# Patient Record
Sex: Male | Born: 1965 | Race: Black or African American | Hispanic: No | Marital: Married | Smoking: Former smoker
Health system: Southern US, Community
[De-identification: ages and names within clinical notes are randomized; demographics above are authoritative.]

## PROBLEM LIST (undated history)

## (undated) DIAGNOSIS — K635 Polyp of colon: Secondary | ICD-10-CM

## (undated) DIAGNOSIS — Z720 Tobacco use: Secondary | ICD-10-CM

## (undated) DIAGNOSIS — E2740 Unspecified adrenocortical insufficiency: Secondary | ICD-10-CM

## (undated) DIAGNOSIS — F32A Depression, unspecified: Secondary | ICD-10-CM

## (undated) DIAGNOSIS — Z8639 Personal history of other endocrine, nutritional and metabolic disease: Secondary | ICD-10-CM

## (undated) DIAGNOSIS — F329 Major depressive disorder, single episode, unspecified: Secondary | ICD-10-CM

## (undated) DIAGNOSIS — K449 Diaphragmatic hernia without obstruction or gangrene: Secondary | ICD-10-CM

## (undated) DIAGNOSIS — K219 Gastro-esophageal reflux disease without esophagitis: Secondary | ICD-10-CM

## (undated) HISTORY — PX: POLYPECTOMY: SHX149

---

## 2011-06-24 ENCOUNTER — Other Ambulatory Visit: Payer: Self-pay | Admitting: Family Medicine

## 2011-06-24 ENCOUNTER — Ambulatory Visit
Admission: RE | Admit: 2011-06-24 | Discharge: 2011-06-24 | Disposition: A | Discharge status: Home / Self Care | Payer: Managed Care, Other (non HMO) | Source: Ambulatory Visit | Attending: Family Medicine | Admitting: Family Medicine

## 2011-06-24 DIAGNOSIS — M12569 Traumatic arthropathy, unspecified knee: Secondary | ICD-10-CM

## 2011-07-23 ENCOUNTER — Other Ambulatory Visit: Payer: Self-pay | Admitting: Family Medicine

## 2011-07-23 ENCOUNTER — Other Ambulatory Visit: Payer: Self-pay | Admitting: Gastroenterology

## 2011-07-23 DIAGNOSIS — R634 Abnormal weight loss: Secondary | ICD-10-CM

## 2011-07-23 DIAGNOSIS — M25522 Pain in left elbow: Secondary | ICD-10-CM

## 2011-07-26 ENCOUNTER — Other Ambulatory Visit: Payer: Managed Care, Other (non HMO)

## 2011-07-29 ENCOUNTER — Ambulatory Visit
Admission: RE | Admit: 2011-07-29 | Discharge: 2011-07-29 | Disposition: A | Discharge status: Home / Self Care | Payer: Managed Care, Other (non HMO) | Source: Ambulatory Visit | Attending: Gastroenterology | Admitting: Gastroenterology

## 2011-07-29 ENCOUNTER — Ambulatory Visit: Payer: Managed Care, Other (non HMO)

## 2011-07-29 DIAGNOSIS — R634 Abnormal weight loss: Secondary | ICD-10-CM

## 2011-07-29 MED ORDER — IOHEXOL 300 MG/ML  SOLN
100.0000 mL | Freq: Once | INTRAMUSCULAR | Status: AC | PRN
  Administered 2011-07-29: 100 mL via INTRAVENOUS

## 2011-08-05 DIAGNOSIS — K635 Polyp of colon: Secondary | ICD-10-CM

## 2011-08-05 HISTORY — DX: Polyp of colon: K63.5

## 2011-08-06 ENCOUNTER — Emergency Department (HOSPITAL_COMMUNITY): Payer: Managed Care, Other (non HMO)

## 2011-08-06 ENCOUNTER — Observation Stay (HOSPITAL_COMMUNITY)
Admission: EM | Admit: 2011-08-06 | Discharge: 2011-08-08 | DRG: 312 | Disposition: A | Discharge status: Home / Self Care | Payer: Managed Care, Other (non HMO) | Attending: Family Medicine | Admitting: Family Medicine

## 2011-08-06 ENCOUNTER — Encounter (HOSPITAL_COMMUNITY): Payer: Self-pay | Admitting: Emergency Medicine

## 2011-08-06 ENCOUNTER — Inpatient Hospital Stay (HOSPITAL_COMMUNITY): Payer: Managed Care, Other (non HMO)

## 2011-08-06 DIAGNOSIS — R404 Transient alteration of awareness: Secondary | ICD-10-CM | POA: Insufficient documentation

## 2011-08-06 DIAGNOSIS — R42 Dizziness and giddiness: Secondary | ICD-10-CM | POA: Diagnosis present

## 2011-08-06 DIAGNOSIS — E538 Deficiency of other specified B group vitamins: Secondary | ICD-10-CM | POA: Diagnosis present

## 2011-08-06 DIAGNOSIS — D509 Iron deficiency anemia, unspecified: Secondary | ICD-10-CM | POA: Diagnosis present

## 2011-08-06 DIAGNOSIS — E860 Dehydration: Secondary | ICD-10-CM | POA: Insufficient documentation

## 2011-08-06 DIAGNOSIS — I951 Orthostatic hypotension: Principal | ICD-10-CM | POA: Diagnosis present

## 2011-08-06 DIAGNOSIS — F329 Major depressive disorder, single episode, unspecified: Secondary | ICD-10-CM

## 2011-08-06 DIAGNOSIS — R634 Abnormal weight loss: Secondary | ICD-10-CM | POA: Diagnosis present

## 2011-08-06 DIAGNOSIS — F32A Depression, unspecified: Secondary | ICD-10-CM | POA: Diagnosis present

## 2011-08-06 DIAGNOSIS — F3289 Other specified depressive episodes: Secondary | ICD-10-CM | POA: Insufficient documentation

## 2011-08-06 DIAGNOSIS — E119 Type 2 diabetes mellitus without complications: Secondary | ICD-10-CM | POA: Diagnosis present

## 2011-08-06 HISTORY — DX: Depression, unspecified: F32.A

## 2011-08-06 HISTORY — DX: Major depressive disorder, single episode, unspecified: F32.9

## 2011-08-06 LAB — BASIC METABOLIC PANEL
BUN: 13 mg/dL (ref 6–23)
CO2: 24 mEq/L (ref 19–32)
Chloride: 98 mEq/L (ref 96–112)
Creatinine, Ser: 0.91 mg/dL (ref 0.50–1.35)
Glucose, Bld: 108 mg/dL — ABNORMAL HIGH (ref 70–99)
Potassium: 4.1 mEq/L (ref 3.5–5.1)

## 2011-08-06 LAB — CBC
HCT: 31.8 % — ABNORMAL LOW (ref 39.0–52.0)
Hemoglobin: 10.5 g/dL — ABNORMAL LOW (ref 13.0–17.0)
MCV: 72.3 fL — ABNORMAL LOW (ref 78.0–100.0)
RDW: 17.8 % — ABNORMAL HIGH (ref 11.5–15.5)
WBC: 13.5 10*3/uL — ABNORMAL HIGH (ref 4.0–10.5)

## 2011-08-06 LAB — GRAM STAIN

## 2011-08-06 LAB — URINALYSIS, ROUTINE W REFLEX MICROSCOPIC
Bilirubin Urine: NEGATIVE
Glucose, UA: NEGATIVE mg/dL
Hgb urine dipstick: NEGATIVE
Specific Gravity, Urine: 1.012 (ref 1.005–1.030)
pH: 6.5 (ref 5.0–8.0)

## 2011-08-06 LAB — OCCULT BLOOD, POC DEVICE: Fecal Occult Bld: NEGATIVE

## 2011-08-06 LAB — URINE MICROSCOPIC-ADD ON

## 2011-08-06 LAB — GLUCOSE, CAPILLARY

## 2011-08-06 MED ORDER — OXYCODONE HCL 5 MG PO TABS
5.0000 mg | ORAL_TABLET | ORAL | Status: DC | PRN
  Administered 2011-08-07 – 2011-08-08 (×5): 5 mg via ORAL
  Filled 2011-08-06 (×5): qty 1

## 2011-08-06 MED ORDER — SODIUM CHLORIDE 0.9 % IV BOLUS (SEPSIS)
1000.0000 mL | Freq: Once | INTRAVENOUS | Status: AC
  Administered 2011-08-06: 1000 mL via INTRAVENOUS

## 2011-08-06 MED ORDER — ACETAMINOPHEN 325 MG PO TABS
650.0000 mg | ORAL_TABLET | Freq: Four times a day (QID) | ORAL | Status: DC | PRN
  Administered 2011-08-07: 650 mg via ORAL
  Filled 2011-08-06: qty 2

## 2011-08-06 MED ORDER — HYDROMORPHONE HCL PF 1 MG/ML IJ SOLN: 0.5000 mg | INTRAMUSCULAR | Status: DC | PRN

## 2011-08-06 MED ORDER — HYDROCODONE-ACETAMINOPHEN 5-325 MG PO TABS
1.0000 | ORAL_TABLET | Freq: Once | ORAL | Status: AC
  Administered 2011-08-06: 1 via ORAL
  Filled 2011-08-06: qty 1

## 2011-08-06 MED ORDER — ONDANSETRON HCL 4 MG/2ML IJ SOLN: 4.0000 mg | Freq: Four times a day (QID) | INTRAMUSCULAR | Status: DC | PRN

## 2011-08-06 MED ORDER — INSULIN ASPART 100 UNIT/ML ~~LOC~~ SOLN: 0.0000 [IU] | Freq: Every day | SUBCUTANEOUS | Status: DC

## 2011-08-06 MED ORDER — ACETAMINOPHEN 650 MG RE SUPP: 650.0000 mg | Freq: Four times a day (QID) | RECTAL | Status: DC | PRN

## 2011-08-06 MED ORDER — SODIUM CHLORIDE 0.9 % IV SOLN
INTRAVENOUS | Status: DC
  Administered 2011-08-06 – 2011-08-08 (×3): via INTRAVENOUS

## 2011-08-06 MED ORDER — ZOLPIDEM TARTRATE 5 MG PO TABS: 5.0000 mg | ORAL_TABLET | Freq: Every evening | ORAL | Status: DC | PRN

## 2011-08-06 MED ORDER — ALUM & MAG HYDROXIDE-SIMETH 200-200-20 MG/5ML PO SUSP: 30.0000 mL | Freq: Four times a day (QID) | ORAL | Status: DC | PRN

## 2011-08-06 MED ORDER — INSULIN ASPART 100 UNIT/ML ~~LOC~~ SOLN
0.0000 [IU] | Freq: Three times a day (TID) | SUBCUTANEOUS | Status: DC
  Administered 2011-08-07: 1 [IU] via SUBCUTANEOUS

## 2011-08-06 MED ORDER — SODIUM CHLORIDE 0.9 % IV SOLN: INTRAVENOUS | Status: DC

## 2011-08-06 MED ORDER — SODIUM CHLORIDE 0.9 % IJ SOLN
3.0000 mL | Freq: Two times a day (BID) | INTRAMUSCULAR | Status: DC
  Administered 2011-08-06 – 2011-08-07 (×2): 3 mL via INTRAVENOUS

## 2011-08-06 MED ORDER — ONDANSETRON HCL 4 MG PO TABS: 4.0000 mg | ORAL_TABLET | Freq: Four times a day (QID) | ORAL | Status: DC | PRN

## 2011-08-06 MED ORDER — ENOXAPARIN SODIUM 40 MG/0.4ML ~~LOC~~ SOLN
40.0000 mg | SUBCUTANEOUS | Status: DC
  Administered 2011-08-06: 40 mg via SUBCUTANEOUS
  Filled 2011-08-06 (×3): qty 0.4

## 2011-08-06 NOTE — ED Notes (Signed)
Pt reporting multiple syncopal episodes for several weeks, last was one a few days ago. Pt reports bp drops and then he passes out. Pt has multiple abrasions to forehead, right knee, left leg. Pt denying any pain. No cp or sob. Reporting depressed due to loss of mother. Pt reports wt loss, but has been eating lately and is concerned about not gaining wt. Pt is a x 4. Speaking in clear sentences. No neuro deficits.

## 2011-08-06 NOTE — ED Notes (Addendum)
Pt sent by PCP for eval of weight loss and syncopal episode over last week from hypotension; pt sts lost 65lb despite eating; pt sts having trouble with depression since his mother passed away 1 year ago

## 2011-08-06 NOTE — H&P (Signed)
[facility] History and Physical  CZAR YSAGUIRRE RUE:[id number] DOB: 05-26-1965 DOA: 08/06/2011  Referring physician: ED PCP:  Dr. Renaye Rakers  Chief Complaint: Dizziness and Passed Out  HPI: Frederick Maddox is an 46 y.o. Male who passed out today PTA, and has had complaints of Dizziness and falls for the past 2 weeks.  He fell 3 days ago and hit his forehead, left arm and right knee.  He was seen by his PCP and evaluated and diagnosed with contusions and a hematoma of the left forearm.  When he passed out he states he was getting up to walk into another room and blacked out, he was dizzy, and had nausea , he denies having chest pain or SOB .  H e has lost over 65 pounds over the past 6 months after the loss of his mother to complications of CHF.  He has a history of depression and reports that he was depressed and did not eat very much for the past 5 months until he went to stay with his brother and his family, and he began to regain his appetite, and go to counseling.    Review of Systems:  The patient denies fever, vision loss, decreased hearing, hoarseness, chest pain, dyspnea on exertion, peripheral edema, balance deficits, hemoptysis, abdominal pain, melena, hematochezia, severe indigestion/heartburn, hematuria, incontinence, genital sores, muscle weakness, suspicious skin lesions, transient blindness, difficulty walking,  enlarged lymph nodes, angioedema, and breast masses.    Past Medical History  Diagnosis Date  . Diabetes mellitus   . Depression    History reviewed. No pertinent past surgical history.    Prior to Admission medications   Medication Sig Start Date End Date Taking? Authorizing Provider  acetaminophen (TYLENOL) 500 MG tablet Take 1,000 mg by mouth every 6 (six) hours as needed. pain   Yes Historical Provider, MD  HYDROcodone-acetaminophen (VICODIN) 5-500 MG per tablet Take 1 tablet by mouth every 6 (six) hours as needed. pain   Yes Historical Provider, MD    megestrol (MEGACE) 40 MG/ML suspension Take 200 mg by mouth daily.   Yes Historical Provider, MD  testosterone (ANDROGEL) 50 MG/5GM GEL Place 10 g onto the skin daily. 1 pump to each arm daily .   Yes Historical Provider, MD  Vilazodone HCl (VIIBRYD) 40 MG TABS Take 40 mg by mouth daily.   Yes Historical Provider, MD    No Known Allergies    Social History:      Lives at Home, Able to Perform his ADLs.    reports that he has been smoking.  He does not have any smokeless tobacco history on file. He reports that he does not drink alcohol or use illicit drugs.   Family History  Problem Relation Age of Onset  . Diabetes type II      (be sure to complete)    GEN:  Pleasant thin well developed 46 year old African American Male  examined  and in no acute distress; cooperative with exam  Filed Vitals:  08/06/11 1615 08/06/11 1700 08/06/11 1832 08/06/11 1911 BP: 126/79 123/73 116/72 122/76 Pulse: 98 96 97 93 Temp:     TempSrc:     Resp: 23 22 18 19  SpO2: 100% 100% 100% 100%  Blood pressure 122/76, pulse 93, temperature 98.5 F (36.9 C), temperature source Oral, resp. rate 19, SpO2 100.00%. PSYCH: He is alert and oriented x4; does not appear anxious does not appear depressed; affect is normal HEENT: Normocephalic and + Hematoma Right Frontal  Area, Mucous membranes pink; PERRLA; EOM intact; Fundi:  Benign;  No scleral icterus, Nares: Patent, Oropharynx: Clear, Fair Dentition, Neck:  FROM, no cervical lymphadenopathy nor thyromegaly or carotid bruit; no JVD; Breasts:: Not examined CHEST WALL: No tenderness CHEST: Normal respiration, clear to auscultation bilaterally HEART: Regular rate and rhythm; no murmurs rubs or gallops BACK: No kyphosis or scoliosis; no CVA tenderness ABDOMEN: Positive Bowel Sounds, Scaphoid,  soft non-tender; no masses, no organomegaly.   Rectal Exam: Not done EXTREMITIES:  + Edema/Effusion of Right Peri-patellar area,  no cyanosis, clubbing or edema; no  ulcerations, + Abrasion Right Patella. Genitalia: not examined PULSES: 2+ and symmetric SKIN: Normal hydration no rash or ulceration CNS: Cranial nerves 2-12 grossly intact no focal neurologic deficit   Labs on Admission:  Basic Metabolic Panel:  Lab 08/06/11 4098  NA 136  K 4.1  CL 98  CO2 24  GLUCOSE 108*  BUN 13  CREATININE 0.91  CALCIUM 10.4  MG --  PHOS --   Liver Function Tests: No results found for this basename: AST:5,ALT:5,ALKPHOS:5,BILITOT:5,PROT:5,ALBUMIN:5 in the last 168 hours No results found for this basename: LIPASE:5,AMYLASE:5 in the last 168 hours No results found for this basename: AMMONIA:5 in the last 168 hours CBC:  Lab 08/06/11 1421  WBC 13.5*  NEUTROABS --  HGB 10.5*  HCT 31.8*  MCV 72.3*  PLT 370   Cardiac Enzymes:  Lab 08/06/11 1421  CKTOTAL --  CKMB --  CKMBINDEX --  TROPONINI <0.30    BNP (last 3 results) No results found for this basename: PROBNP:3 in the last 8760 hours CBG: No results found for this basename: GLUCAP:5 in the last 168 hours  Radiological Exams on Admission: Dg Chest 2 View  08/06/2011  *RADIOLOGY REPORT*  Clinical Data: Hypotension.  Weight loss  CHEST - 2 VIEW  Comparison:  None.  Findings:  The heart size and mediastinal contours are within normal limits.  Both lungs are clear.  The visualized skeletal structures are unremarkable.  IMPRESSION: No active cardiopulmonary disease.  Original Report Authenticated By: Camelia Phenes, M.D.    EKG: Reviewed:  Normal Sinus Rhythm No acute ST changes.     Assessment: Principal Problem:  *Orthostatic hypotension Active Problems:  Weight loss  Dizziness  Diabetes mellitus  Depression  Plan:    Admitted for Syncope Workup, etiology most likely Orthostatic hypotension.   Rehydrate Check albumin and pre-Albumin levels, and TSH Reconcile home Meds.   SSI coverage PRN elevated Blood Sugars. DVT prophylaxis Continue Counseling as Outpatient.   Code Status:    FULL CODE Family Communication:   Brother At Bedside Disposition Plan: Return Home Time spent: 60 minutes.    Ron Parker [facility] Pager [phone]  If 7PM-7AM, please contact night-coverage [email or web address] Password [facility] 08/06/2011, 8:15 PM

## 2011-08-06 NOTE — ED Notes (Signed)
Admitting at bedside 

## 2011-08-06 NOTE — ED Provider Notes (Signed)
History     CSN: [id number]  Arrival date & time 08/06/11  1349   First MD Initiated Contact with Patient 08/06/11 1501      Chief Complaint  Patient presents with  . Hypotension  . Loss of Consciousness  . Weight Loss    (Consider location/radiation/quality/duration/timing/severity/associated sxs/prior treatment) Patient is a 46 y.o. male presenting with syncope. The history is provided by the patient.  Loss of Consciousness This is a new problem. The current episode started in the past 7 days. The problem occurs every several days. The problem has been unchanged. Pertinent negatives include no abdominal pain, anorexia, change in bowel habit, chest pain, congestion, diaphoresis, fever, headaches, nausea, neck pain, numbness, vertigo, visual change, vomiting or weakness. Associated symptoms comments: 65 lb weight loss in the past year. Exacerbated by: changing position. He has tried nothing for the symptoms.    Past Medical History  Diagnosis Date  . Diabetes mellitus   . Depression     History reviewed. No pertinent past surgical history.  Family History  Problem Relation Age of Onset  . Diabetes type II      History  Substance Use Topics  . Smoking status: Current Everyday Smoker  . Smokeless tobacco: Not on file  . Alcohol Use: No      Review of Systems  Constitutional: Positive for unexpected weight change. Negative for fever and diaphoresis.  HENT: Negative for congestion and neck pain.   Eyes: Negative.   Respiratory: Negative for shortness of breath.   Cardiovascular: Positive for syncope. Negative for chest pain, palpitations and leg swelling.  Gastrointestinal: Negative for nausea, vomiting, abdominal pain, diarrhea, blood in stool, anorexia and change in bowel habit.  Genitourinary: Negative.   Neurological: Positive for syncope. Negative for vertigo, seizures, facial asymmetry, weakness, numbness and headaches.  All other systems reviewed and are  negative.    Allergies  Review of patient's allergies indicates no known allergies.  Home Medications   No current outpatient prescriptions on file.  BP 95/55  Pulse 106  Temp 98.7 F (37.1 C) (Oral)  Resp 19  Ht 6\' 1"  (1.854 m)  Wt 160 lb (72.576 kg)  BMI 21.11 kg/m2  SpO2 100%  Physical Exam  Nursing note and vitals reviewed. Constitutional: He is oriented to person, place, and time. He appears well-developed and well-nourished. No distress.  HENT:  Head: Normocephalic and atraumatic.  Eyes: Conjunctivae and EOM are normal. Pupils are equal, round, and reactive to light.  Neck: Neck supple.  Cardiovascular: Normal rate, regular rhythm, normal heart sounds and intact distal pulses.   No murmur heard. Pulmonary/Chest: Effort normal and breath sounds normal. He has no wheezes. He has no rales.  Abdominal: Soft. He exhibits no distension. There is no tenderness.  Musculoskeletal: Normal range of motion.       Right knee: He exhibits normal range of motion (extensor mechanism intact), no swelling, no effusion, no deformity, no erythema, normal alignment, no LCL laxity, normal patellar mobility, no bony tenderness, normal meniscus and no MCL laxity. tenderness (anterior) found.  Neurological: He is alert and oriented to person, place, and time. He has normal strength. No cranial nerve deficit or sensory deficit. Coordination and gait normal. GCS eye subscore is 4. GCS verbal subscore is 5. GCS motor subscore is 6.  Skin: Skin is warm and dry.    ED Course  Procedures (including critical care time)  Labs Reviewed  CBC - Abnormal; Notable for the following:    WBC  13.5 (*)     Hemoglobin 10.5 (*)     HCT 31.8 (*)     MCV 72.3 (*)     MCH 23.9 (*)     RDW 17.8 (*)     All other components within normal limits  BASIC METABOLIC PANEL - Abnormal; Notable for the following:    Glucose, Bld 108 (*)     All other components within normal limits  URINALYSIS, ROUTINE W REFLEX  MICROSCOPIC - Abnormal; Notable for the following:    Protein, ur 30 (*)     Urobilinogen, UA 2.0 (*)     All other components within normal limits  TROPONIN I  GRAM STAIN  OCCULT BLOOD, POC DEVICE  URINE MICROSCOPIC-ADD ON  GLUCOSE, CAPILLARY  BASIC METABOLIC PANEL  CBC  VITAMIN B12  FOLATE  IRON AND TIBC  FERRITIN  RETICULOCYTES  HEMOGLOBIN A1C   Dg Chest 2 View  08/06/2011  *RADIOLOGY REPORT*  Clinical Data: Hypotension.  Weight loss  CHEST - 2 VIEW  Comparison:  None.  Findings:  The heart size and mediastinal contours are within normal limits.  Both lungs are clear.  The visualized skeletal structures are unremarkable.  IMPRESSION: No active cardiopulmonary disease.  Original Report Authenticated By: Camelia Phenes, M.D.   Dg Knee 1-2 Views Right  08/06/2011  *RADIOLOGY REPORT*  Clinical Data: Fall with right knee pain.  RIGHT KNEE - 1-2 VIEW  Comparison: 06/24/2011  Findings: No evidence of acute fracture, subluxation or dislocation identified.  A very small knee effusion is present.  No radio-opaque foreign bodies are present.  No focal bony lesions are noted.  The joint spaces are unremarkable.  IMPRESSION: Very small knee effusion without acute bony abnormality.  Original Report Authenticated By: Rosendo Gros, M.D.     1. Orthostatic hypotension   2. Weight loss   3. Diabetes mellitus   4. Dizziness   5. Depression       MDM  46 yo male with history of DM presents with 1 year history of 65 lb weight loss after his mother died and has had 4 syncopal episodes in the past week.  Last episode was 2 days ago.  Pt reports hitting his head and right knee.  States that he has checked his BP and it has been low associated with the episodes.  DM well controlled with no episodes of hypoglycemia.  AF, VSS, NAD at presentation.  Physical exam as above with nml neuro exam.  Hgb 10.5, WBC 13.5.  BMP wnl.  Will check orthostatics, hemoccult, UA and give fluid bolus.   Pt with positive  orthostatic hypotension.  Will give additional bolus.  Hemoccult negative.  UA w/o signs of infection.  Will consult medicine for admission for weight loss and syncopal episodes.    Pt admitted to the Hospitalist service in stable condition.     Cherre Robins, MD 08/07/11 [phone]

## 2011-08-06 NOTE — ED Notes (Signed)
Patient transported to X-ray 

## 2011-08-07 DIAGNOSIS — D509 Iron deficiency anemia, unspecified: Secondary | ICD-10-CM | POA: Diagnosis present

## 2011-08-07 LAB — BASIC METABOLIC PANEL
BUN: 11 mg/dL (ref 6–23)
Chloride: 102 mEq/L (ref 96–112)
GFR calc Af Amer: >90 mL/min (ref >90)
Glucose, Bld: 94 mg/dL (ref 70–99)
Potassium: 3.6 mEq/L (ref 3.5–5.1)
Sodium: 136 mEq/L (ref 135–145)

## 2011-08-07 LAB — RETICULOCYTES: RBC.: 3.28 MIL/uL — ABNORMAL LOW (ref 4.22–5.81)

## 2011-08-07 LAB — CBC
HCT: 23.7 % — ABNORMAL LOW (ref 39.0–52.0)
Hemoglobin: 7.9 g/dL — ABNORMAL LOW (ref 13.0–17.0)
MCH: 24.1 pg — ABNORMAL LOW (ref 26.0–34.0)
MCHC: 33.3 g/dL (ref 30.0–36.0)
RBC: 3.28 MIL/uL — ABNORMAL LOW (ref 4.22–5.81)

## 2011-08-07 LAB — GLUCOSE, CAPILLARY
Glucose-Capillary: 84 mg/dL (ref 70–99)
Glucose-Capillary: 130 mg/dL — ABNORMAL HIGH (ref 70–99)

## 2011-08-07 LAB — IRON AND TIBC
Saturation Ratios: 4 % — ABNORMAL LOW (ref 20–55)
TIBC: 327 ug/dL (ref 215–435)
UIBC: 313 ug/dL (ref 125–400)

## 2011-08-07 LAB — HEMOGLOBIN A1C: Mean Plasma Glucose: 128 mg/dL — ABNORMAL HIGH (ref <117)

## 2011-08-07 LAB — FOLATE: Folate: 10.6 ng/mL

## 2011-08-07 MED ORDER — FERROUS SULFATE 325 (65 FE) MG PO TABS
325.0000 mg | ORAL_TABLET | Freq: Every day | ORAL | Status: DC
Start: 2011-08-08 — End: 2011-08-08
  Administered 2011-08-08: 325 mg via ORAL
  Filled 2011-08-07 (×2): qty 1

## 2011-08-07 MED ORDER — BOOST / RESOURCE BREEZE PO LIQD
1.0000 | Freq: Two times a day (BID) | ORAL | Status: DC
  Administered 2011-08-07 – 2011-08-08 (×2): 1 via ORAL

## 2011-08-07 MED ORDER — PANTOPRAZOLE SODIUM 40 MG PO TBEC
40.0000 mg | DELAYED_RELEASE_TABLET | Freq: Every day | ORAL | Status: DC
  Administered 2011-08-07 – 2011-08-08 (×2): 40 mg via ORAL
  Filled 2011-08-07: qty 1

## 2011-08-07 MED ORDER — DOCUSATE SODIUM 100 MG PO CAPS
100.0000 mg | ORAL_CAPSULE | Freq: Two times a day (BID) | ORAL | Status: DC
  Administered 2011-08-07 – 2011-08-08 (×2): 100 mg via ORAL
  Filled 2011-08-07 (×4): qty 1

## 2011-08-07 NOTE — Evaluation (Signed)
Occupational Therapy Evaluation and DIschage Patient Details Name: Frederick Maddox MRN: [id number] DOB: 05/24/1965 Today's Date: 08/07/2011 Time: 8295-6213 OT Time Calculation (min): 30 min  OT Assessment / Plan / Recommendation Clinical Impression  This 46 yo male admitted due to syncopal episode with definite orthostatic HTN, however asymptomatic during session today. No further OT needs noted, will sign off. Knee pain (RLE) is main limiting factor.    OT Assessment  Patient does not need any further OT services    Follow Up Recommendations  No OT follow up    Barriers to Discharge      Equipment Recommendations  None recommended by OT    Recommendations for Other Services    Frequency       Precautions / Restrictions Precautions Precautions: Fall Restrictions Weight Bearing Restrictions: No   Pertinent Vitals/Pain 5/ 10 knee    ADL  Eating/Feeding: Simulated;Independent Where Assessed - Eating/Feeding: Edge of bed Grooming: Simulated;Modified independent (increased tim) Where Assessed - Grooming: Unsupported standing Upper Body Bathing: Simulated;Modified independent (increased time) Where Assessed - Upper Body Bathing: Unsupported sit to stand Lower Body Bathing: Simulated;Modified independent (increased time) Where Assessed - Lower Body Bathing: Unsupported sit to stand Upper Body Dressing: Simulated;Modified independent (increased time) Where Assessed - Upper Body Dressing: Unsupported sit to stand Lower Body Dressing: Performed;Modified independent (increased time) Where Assessed - Lower Body Dressing: Unsupported sit to stand Toilet Transfer: Simulated;Modified independent (increased time) Toilet Transfer Method: Sit to stand Toilet Transfer Equipment:  (Bed, out door, down hall, back to bed) Toileting - Architect and Hygiene: Simulated;Independent Where Assessed - Toileting Clothing Manipulation and Hygiene: Standing Transfers/Ambulation Related to  ADLs: Mod I with increased time especially when he stands needs 1-2 minutes to get use to  putting RLE on ground before he starts ambulating    OT Diagnosis:    OT Problem List:   OT Treatment Interventions:     OT Goals    Visit Information  Last OT Received On: 08/07/11 Assistance Needed: +1    Subjective Data  Subjective: "I have fallen 3 or 4 times" Patient Stated Goal: Did not ask   Prior Functioning  Vision/Perception  Home Living Lives With: Family (brother and grandmother) Available Help at Discharge: Family;Available 24 hours/day Type of Home: House Home Access: Stairs to enter [employer] of Steps: 4 Entrance Stairs-Rails: Can reach both Home Layout: One level Bathroom Shower/Tub: Engineer, manufacturing systems: Standard (sink beside) Home Adaptive Equipment: Straight cane Prior Function Level of Independence: Independent Able to Take Stairs?: Yes Driving: Yes Communication Communication: No difficulties Dominant Hand: Right      Cognition  Overall Cognitive Status: Appears within functional limits for tasks assessed/performed Arousal/Alertness: Awake/alert Orientation Level: Appears intact for tasks assessed Behavior During Session: [facility] for tasks performed    Extremity/Trunk Assessment Right Upper Extremity Assessment RUE ROM/Strength/Tone: Within functional levels Left Upper Extremity Assessment LUE ROM/Strength/Tone: Within functional levels (does have a hematoma forearm near elbow)   Mobility Bed Mobility Bed Mobility: Supine to Sit;Sitting - Scoot to Edge of Bed Supine to Sit: 7: Independent Sitting - Scoot to Edge of Bed: 7: Independent Transfers Transfers: Sit to Stand;Stand to Sit Sit to Stand: 6: Modified independent (Device/Increase time);With upper extremity assist;From bed Stand to Sit: 6: Modified independent (Device/Increase time);With upper extremity assist;To bed   Exercise    Balance    End of Session OT - End of  Session Activity Tolerance: Patient tolerated treatment well Patient left: in bed;with call bell/phone  within reach (RLE propped up on pillows(3) and iced (20 minutes)) Nurse Communication: Mobility status (Mod I---nurse). Made patient aware he should keep leg propped up when he is at rest, iced at rest (20 minutes on 40 minutes off), try to bend it when ambulating, tends to walk stiffed legged.       Evette Georges [phone] 08/07/2011, 2:42 PM

## 2011-08-07 NOTE — Progress Notes (Signed)
INITIAL ADULT NUTRITION ASSESSMENT Date: 08/07/2011   Time: 12:40 PM Reason for Assessment: Consult for adult unintentional weight loss > 10 lb over 1 month.   ASSESSMENT: Male 46 y.o.  Dx: Orthostatic hypotension  Hx:  Past Medical History  Diagnosis Date  . Diabetes mellitus   . Depression     Related Meds:  Scheduled Meds:   . enoxaparin (LOVENOX) injection  40 mg Subcutaneous Q24H  . HYDROcodone-acetaminophen  1 tablet Oral Once  . insulin aspart  0-5 Units Subcutaneous QHS  . insulin aspart  0-9 Units Subcutaneous TID WC  . sodium chloride  1,000 mL Intravenous Once  . sodium chloride  1,000 mL Intravenous Once  . sodium chloride  3 mL Intravenous Q12H  . DISCONTD: sodium chloride   Intravenous STAT   Continuous Infusions:   . sodium chloride 125 mL/hr at 08/06/11 2110   PRN Meds:.acetaminophen, acetaminophen, alum & mag hydroxide-simeth, HYDROmorphone (DILAUDID) injection, ondansetron (ZOFRAN) IV, ondansetron, oxyCODONE, zolpidem   Ht: 6\' 1"  (185.4 cm)  Wt: 164 lb (74.39 kg)  Ideal Wt: 83.63 % Ideal Wt: 89% Wt Readings from Last 10 Encounters:  08/07/11 164 lb (74.39 kg)    Usual Wt: 220 lb per patient % Usual Wt: 74.5% *Pt reported he would like to get back to weighing 210 lb..   Body mass index is 21.64 kg/(m^2). (WNL)  Food/Nutrition Related Hx: Patient reported his appetite is improved now. He reported it has been poor. He reported his intake PTA was poor. He reported his weight got down to 158 lb..   Labs:  CMP     Component Value Date/Time   NA 136 08/07/2011 0535   K 3.6 08/07/2011 0535   CL 102 08/07/2011 0535   CO2 25 08/07/2011 0535   GLUCOSE 94 08/07/2011 0535   BUN 11 08/07/2011 0535   CREATININE 0.76 08/07/2011 0535   CALCIUM 9.1 08/07/2011 0535   GFRNONAA >90 08/07/2011 0535   GFRAA >90 08/07/2011 0535    Intake/Output Summary (Last 24 hours) at 08/07/11 1241 Last data filed at 08/07/11 0900  Gross per 24 hour  Intake    480 ml  Output    350 ml   Net    130 ml     Diet Order: General  Supplements/Tube Feeding: none at this time  IVF:    sodium chloride Last Rate: 125 mL/hr at 08/06/11 2110    Estimated Nutritional Needs:   Kcal: 1610-9604 Protein: 90-112 grams  Fluid: 1.8-2.5L  NUTRITION DIAGNOSIS: -Inadequate oral intake (NI-2.1).  Status: Ongoing  RELATED TO: poor appetite and unintentional weight loss  AS EVIDENCE BY: pt reported poor intake and 74.5% of UBW.   MONITORING/EVALUATION(Goals): PO intake, weights, labs 1. PO intake > 75% at meals and supplements 2. Minimize weight loss  EDUCATION NEEDS: -No education needs identified at this time  INTERVENTION: 1. Will order patient Resource Breeze BID. Provides 500 kcal and 18 grams of protein daily.  2. RD to follow for nutrition plan of care.   Dietitian [phone]  DOCUMENTATION CODES Per approved criteria  -Not Applicable    Iven Finn [facility] 08/07/2011, 12:40 PM

## 2011-08-07 NOTE — ED Provider Notes (Signed)
I saw and evaluated the patient, reviewed the resident's note and I agree with the findings and plan.   Maevis Mumby, MD 08/07/11 1710 

## 2011-08-07 NOTE — Evaluation (Signed)
Physical Therapy Evaluation Patient Details Name: Frederick Maddox MRN: [id number] DOB: 10/30/1965 Today's Date: 08/07/2011 Time: 4098-1191 PT Time Calculation (min): 11 min  PT Assessment / Plan / Recommendation Clinical Impression  Pt admitted with multiple falls caused by orthostatic hypotension. BPs taken throughout session, limited ambulation secondary to hypotension in standing. Will continue to assess in further sessions, short trial of PT for safety upon d/c.    PT Assessment  Patient needs continued PT services    Follow Up Recommendations  No PT follow up;Supervision for mobility/OOB    Barriers to Discharge        Equipment Recommendations  None recommended by PT    Recommendations for Other Services     Frequency Min 3X/week    Precautions / Restrictions Precautions Precautions: Fall Restrictions Weight Bearing Restrictions: No         Mobility  Bed Mobility Bed Mobility: Supine to Sit;Sitting - Scoot to Edge of Bed Supine to Sit: 7: Independent Sitting - Scoot to Edge of Bed: 7: Independent Sit to Supine: 6: Modified independent (Device/Increase time) Transfers Transfers: Sit to Stand;Stand to Sit Sit to Stand: 4: Min assist;With upper extremity assist;From bed Stand to Sit: 4: Min guard;With upper extremity assist;To bed Details for Transfer Assistance: Min assist required for stand secondary to pt with difficulty extending R knee secondary to pain. Cueing for hand placement Ambulation/Gait Ambulation/Gait Assistance: 4: Min guard Ambulation Distance (Feet): 30 Feet Assistive device: None Gait Pattern: Antalgic;Decreased weight shift to right;Decreased stance time - right;Decreased step length - left;Right flexed knee in stance Gait velocity: decreased gait speed Stairs: No    Exercises     PT Diagnosis: Difficulty walking;Acute pain  PT Problem List: Decreased strength;Decreased range of motion;Decreased activity tolerance;Decreased  mobility;Decreased knowledge of use of DME;Decreased safety awareness;Decreased knowledge of precautions;Cardiopulmonary status limiting activity;Pain PT Treatment Interventions: DME instruction;Gait training;Stair training;Functional mobility training;Therapeutic activities;Therapeutic exercise;Patient/family education   PT Goals Acute Rehab PT Goals PT Goal Formulation: With patient Time For Goal Achievement: 08/14/11 Potential to Achieve Goals: Good Pt will go Sit to Stand: with modified independence PT Goal: Sit to Stand - Progress: Goal set today Pt will go Stand to Sit: with modified independence PT Goal: Stand to Sit - Progress: Goal set today Pt will Transfer Bed to Chair/Chair to Bed: with modified independence PT Transfer Goal: Bed to Chair/Chair to Bed - Progress: Goal set today Pt will Ambulate: >150 feet;with supervision;with least restrictive assistive device PT Goal: Ambulate - Progress: Goal set today Pt will Go Up / Down Stairs: 3-5 stairs;with supervision;with rail(s) PT Goal: Up/Down Stairs - Progress: Goal set today  Visit Information  Last PT Received On: 08/07/11 Assistance Needed: +1    Subjective Data      Prior Functioning  Home Living Lives With: Family (brother and grandmother) Available Help at Discharge: Family;Available 24 hours/day Type of Home: House Home Access: Stairs to enter [employer] of Steps: 4 Entrance Stairs-Rails: Can reach both Home Layout: One level Bathroom Shower/Tub: Engineer, manufacturing systems: Standard Home Adaptive Equipment: Straight cane Prior Function Level of Independence: Independent Able to Take Stairs?: Yes Driving: Yes Vocation: Part time employment Communication Communication: No difficulties Dominant Hand: Right    Cognition  Overall Cognitive Status: Appears within functional limits for tasks assessed/performed Arousal/Alertness: Awake/alert Orientation Level: Appears intact for tasks  assessed Behavior During Session: [facility] for tasks performed    Extremity/Trunk Assessment Right Upper Extremity Assessment RUE ROM/Strength/Tone: Within functional levels Left Upper Extremity Assessment  LUE ROM/Strength/Tone: Within functional levels (does have a hematoma forearm near elbow) Right Lower Extremity Assessment RLE ROM/Strength/Tone: Deficits;Unable to fully assess;Due to pain RLE ROM/Strength/Tone Deficits: R knee swelling limiting full extension or flexion. Painful hip flexion. Left Lower Extremity Assessment LLE ROM/Strength/Tone: Within functional levels   Balance    End of Session PT - End of Session Equipment Utilized During Treatment: Gait belt Activity Tolerance: Patient limited by pain;Treatment limited secondary to medical complications (Comment) Patient left: in bed;with call bell/phone within reach Nurse Communication: Mobility status;Other (comment) (BPs)   Milana Kidney 08/07/2011, 5:24 PM  08/07/2011 Milana Kidney DPT PAGER: [phone] OFFICE: [phone]

## 2011-08-07 NOTE — Progress Notes (Signed)
[facility] Progress Note  08/07/2011  Subjective: Pt says he feels a lot better.  He is still weak.    Objective:  Vital signs in last 24 hours: Filed Vitals:   08/07/11 0600 08/07/11 0601 08/07/11 0602 08/07/11 0605  BP: 110/69 103/64 100/63 95/62  Pulse: 83 90 101 114  Temp: 99.1 F (37.3 C)     TempSrc: Oral     Resp:      Height:      Weight:    74.39 kg (164 lb)  SpO2: 100% 100% 100% 100%   Weight change:   Intake/Output Summary (Last 24 hours) at 08/07/11 1343 Last data filed at 08/07/11 0900  Gross per 24 hour  Intake    480 ml  Output    350 ml  Net    130 ml   Lab Results  Component Value Date   HGBA1C 6.1* 08/06/2011   Lab Results  Component Value Date   CREATININE 0.76 08/07/2011    Review of Systems As above, otherwise all reviewed and reported negative  Physical Exam General - awake, thin male, no distress, cooperative HEENT - NCAT, MMM Lungs - BBS, CTA CV - normal s1, s2 sounds Abd - soft, nondistended, no masses, nontender Ext - no C/C/E Neuro - nonfocal   Lab Results: Results for orders placed during the hospital encounter of 08/06/11 (from the past 24 hour(s))  CBC     Status: Abnormal   Collection Time   08/06/11  2:21 PM      Component Value Range   WBC 13.5 (*) 4.0 - 10.5 K/uL   RBC 4.40  4.22 - 5.81 MIL/uL   Hemoglobin 10.5 (*) 13.0 - 17.0 g/dL   HCT 16.1 (*) 09.6 - 04.5 %   MCV 72.3 (*) 78.0 - 100.0 fL   MCH 23.9 (*) 26.0 - 34.0 pg   MCHC 33.0  30.0 - 36.0 g/dL   RDW 40.9 (*) 81.1 - 91.4 %   Platelets 370  150 - 400 K/uL  BASIC METABOLIC PANEL     Status: Abnormal   Collection Time   08/06/11  2:21 PM      Component Value Range   Sodium 136  135 - 145 mEq/L   Potassium 4.1  3.5 - 5.1 mEq/L   Chloride 98  96 - 112 mEq/L   CO2 24  19 - 32 mEq/L   Glucose, Bld 108 (*) 70 - 99 mg/dL   BUN 13  6 - 23 mg/dL   Creatinine, Ser 7.82  0.50 - 1.35 mg/dL   Calcium 95.6  8.4 - 21.3 mg/dL   GFR calc non Af Amer >90  >90 mL/min   GFR calc Af Amer >90  >90 mL/min  TROPONIN I     Status: Normal   Collection Time   08/06/11  2:21 PM      Component Value Range   Troponin I <0.30  <0.30 ng/mL  OCCULT BLOOD, POC DEVICE     Status: Normal   Collection Time   08/06/11  4:09 PM      Component Value Range   Fecal Occult Bld NEGATIVE    URINALYSIS, ROUTINE W REFLEX MICROSCOPIC     Status: Abnormal   Collection Time   08/06/11  4:49 PM      Component Value Range   Color, Urine YELLOW  YELLOW   APPearance CLEAR  CLEAR   Specific Gravity, Urine 1.012  1.005 - 1.030   pH 6.5  5.0 - 8.0   Glucose, UA NEGATIVE  NEGATIVE mg/dL   Hgb urine dipstick NEGATIVE  NEGATIVE   Bilirubin Urine NEGATIVE  NEGATIVE   Ketones, ur NEGATIVE  NEGATIVE mg/dL   Protein, ur 30 (*) NEGATIVE mg/dL   Urobilinogen, UA 2.0 (*) 0.0 - 1.0 mg/dL   Nitrite NEGATIVE  NEGATIVE   Leukocytes, UA NEGATIVE  NEGATIVE  GRAM STAIN     Status: Normal   Collection Time   08/06/11  4:49 PM      Component Value Range   Specimen Description URINE, RANDOM     Special Requests NONE     Gram Stain       Value: CYTOSPIN SLIDE     WBC PRESENT, PREDOMINANTLY PMN     NEGATIVE FOR BACTERIA   Report Status 08/06/2011 FINAL    URINE MICROSCOPIC-ADD ON     Status: Normal   Collection Time   08/06/11  4:49 PM      Component Value Range   Squamous Epithelial / LPF RARE  RARE   WBC, UA 0-2  <3 WBC/hpf  GLUCOSE, CAPILLARY     Status: Normal   Collection Time   08/06/11  8:47 PM      Component Value Range   Glucose-Capillary 98  70 - 99 mg/dL   Comment 1 Notify RN    HEMOGLOBIN A1C     Status: Abnormal   Collection Time   08/06/11  9:07 PM      Component Value Range   Hemoglobin A1C 6.1 (*) <5.7 %   Mean Plasma Glucose 128 (*) <117 mg/dL  BASIC METABOLIC PANEL     Status: Normal   Collection Time   08/07/11  5:35 AM      Component Value Range   Sodium 136  135 - 145 mEq/L   Potassium 3.6  3.5 - 5.1 mEq/L   Chloride 102  96 - 112 mEq/L   CO2 25  19 - 32 mEq/L   Glucose,  Bld 94  70 - 99 mg/dL   BUN 11  6 - 23 mg/dL   Creatinine, Ser 0.98  0.50 - 1.35 mg/dL   Calcium 9.1  8.4 - 11.9 mg/dL   GFR calc non Af Amer >90  >90 mL/min   GFR calc Af Amer >90  >90 mL/min  CBC     Status: Abnormal   Collection Time   08/07/11  5:35 AM      Component Value Range   WBC 10.6 (*) 4.0 - 10.5 K/uL   RBC 3.28 (*) 4.22 - 5.81 MIL/uL   Hemoglobin 7.9 (*) 13.0 - 17.0 g/dL   HCT 14.7 (*) 82.9 - 56.2 %   MCV 72.3 (*) 78.0 - 100.0 fL   MCH 24.1 (*) 26.0 - 34.0 pg   MCHC 33.3  30.0 - 36.0 g/dL   RDW 13.0 (*) 86.5 - 78.4 %   Platelets 304  150 - 400 K/uL  IRON AND TIBC     Status: Abnormal   Collection Time   08/07/11  5:35 AM      Component Value Range   Iron 14 (*) 42 - 135 ug/dL   TIBC 696  295 - 284 ug/dL   Saturation Ratios 4 (*) 20 - 55 %   UIBC 313  125 - 400 ug/dL  RETICULOCYTES     Status: Abnormal   Collection Time   08/07/11  5:35 AM      Component Value Range  Retic Ct Pct 1.2  0.4 - 3.1 %   RBC. 3.28 (*) 4.22 - 5.81 MIL/uL   Retic Count, Manual 39.4  19.0 - 186.0 K/uL  GLUCOSE, CAPILLARY     Status: Normal   Collection Time   08/07/11  7:45 AM      Component Value Range   Glucose-Capillary 84  70 - 99 mg/dL   Comment 1 Notify RN    GLUCOSE, CAPILLARY     Status: Normal   Collection Time   08/07/11 11:34 AM      Component Value Range   Glucose-Capillary 92  70 - 99 mg/dL   Comment 1 Notify RN      Micro Results: Recent Results (from the past 240 hour(s))  GRAM STAIN     Status: Normal   Collection Time   08/06/11  4:49 PM      Component Value Range Status Comment   Specimen Description URINE, RANDOM   Final    Special Requests NONE   Final    Gram Stain     Final    Value: CYTOSPIN SLIDE     WBC PRESENT, PREDOMINANTLY PMN     NEGATIVE FOR BACTERIA   Report Status 08/06/2011 FINAL   Final     Medications:  Scheduled Meds:   . enoxaparin (LOVENOX) injection  40 mg Subcutaneous Q24H  . HYDROcodone-acetaminophen  1 tablet Oral Once  . insulin  aspart  0-5 Units Subcutaneous QHS  . insulin aspart  0-9 Units Subcutaneous TID WC  . sodium chloride  1,000 mL Intravenous Once  . sodium chloride  1,000 mL Intravenous Once  . sodium chloride  3 mL Intravenous Q12H  . DISCONTD: sodium chloride   Intravenous STAT   Continuous Infusions:   . sodium chloride 125 mL/hr at 08/06/11 2110   PRN Meds:.acetaminophen, acetaminophen, alum & mag hydroxide-simeth, HYDROmorphone (DILAUDID) injection, ondansetron (ZOFRAN) IV, ondansetron, oxyCODONE, zolpidem  Assessment/Plan:  Orthostatic hypotension - continue syncope work up as ordered, already improving with IVF hydration  Anemia - check Anemia panel, likely pt is iron deficient with low MCV, stool hemoccult negative, start protonix, start iron supplement, outpatient GI consult recommended.    Weight loss - pending Albumin, TSH and prealbumin labs  Dizziness - likely related to significant dehydration  Diabetes mellitus - monitor BS and provide supplemental insulin as needed  Depression - pt to continue outpatient counseling   LOS: 1 day   Clanford Johnson 08/07/2011, 1:43 PM  Cleora Fleet, MD, CDE, FAAFP [facility] [place], [place]  [phone]

## 2011-08-08 DIAGNOSIS — E538 Deficiency of other specified B group vitamins: Secondary | ICD-10-CM | POA: Diagnosis present

## 2011-08-08 DIAGNOSIS — D509 Iron deficiency anemia, unspecified: Secondary | ICD-10-CM

## 2011-08-08 LAB — COMPREHENSIVE METABOLIC PANEL
AST: 20 U/L (ref 0–37)
Alkaline Phosphatase: 165 U/L — ABNORMAL HIGH (ref 39–117)
BUN: 7 mg/dL (ref 6–23)
CO2: 19 mEq/L (ref 19–32)
Chloride: 105 mEq/L (ref 96–112)
Creatinine, Ser: 0.69 mg/dL (ref 0.50–1.35)
GFR calc non Af Amer: >90 mL/min (ref >90)
Total Bilirubin: 0.3 mg/dL (ref 0.3–1.2)

## 2011-08-08 LAB — CBC
MCH: 23.4 pg — ABNORMAL LOW (ref 26.0–34.0)
Platelets: 282 10*3/uL (ref 150–400)
RBC: 3.33 MIL/uL — ABNORMAL LOW (ref 4.22–5.81)
RDW: 18.2 % — ABNORMAL HIGH (ref 11.5–15.5)

## 2011-08-08 LAB — GLUCOSE, CAPILLARY: Glucose-Capillary: 115 mg/dL — ABNORMAL HIGH (ref 70–99)

## 2011-08-08 LAB — PREALBUMIN: Prealbumin: 24.1 mg/dL (ref 17.0–34.0)

## 2011-08-08 MED ORDER — DSS 100 MG PO CAPS
100.0000 mg | ORAL_CAPSULE | Freq: Two times a day (BID) | ORAL | Status: AC | PRN
Start: 2011-08-08 — End: 2011-08-18

## 2011-08-08 MED ORDER — OMEPRAZOLE 20 MG PO CPDR: 20.0000 mg | DELAYED_RELEASE_CAPSULE | Freq: Every day | ORAL | Status: DC

## 2011-08-08 MED ORDER — FERROUS SULFATE 325 (65 FE) MG PO TABS: 325.0000 mg | ORAL_TABLET | Freq: Every day | ORAL | Status: DC

## 2011-08-08 MED ORDER — VITAMIN B-12 1000 MCG PO TABS: 1000.0000 ug | ORAL_TABLET | Freq: Every day | ORAL | Status: DC

## 2011-08-08 NOTE — Discharge Summary (Signed)
Physician Discharge Summary  Patient ID: Frederick Maddox MRN: [id number] DOB/AGE: 1965/12/13 46 y.o.  Admit date: 08/06/2011 Discharge date: 08/08/2011  Discharge Diagnoses:    Weight loss  Dizziness  Diabetes mellitus  Depression  Iron deficiency anemia  B12 deficiency  Orthostatic hypotension - treated with fluids and resolved  dehydration - treated and resolved  Recommendations for providers at follow up:   Please consider ECHO and carotid dopplers if pt has any recurrence of syncopal symptoms.  Please check CBC and UA on follow up visit.    Discharged Condition: good, stable  Hospital Course: From H&P:  Frederick Maddox is an 46 y.o. Male who passed out today PTA, and has had complaints of Dizziness and falls for the past 2 weeks. He fell 3 days ago and hit his forehead, left arm and right knee. He was seen by his PCP and evaluated and diagnosed with contusions and a hematoma of the left forearm. When he passed out he states he was getting up to walk into another room and blacked out, he was dizzy, and had nausea , he denies having chest pain or SOB . H e has lost over 65 pounds over the past 6 months after the loss of his mother to complications of CHF. He has a history of depression and reports that he was depressed and did not eat very much for the past 5 months until he went to stay with his brother and his family, and he began to regain his appetite, and go to counseling.   Orthostatic hypotension - pt improved considerably after IV fluid hydration.   No further syncopal episodes occurred.   PT evaluated and no further PT recommended.    Anemia - Pt is iron deficient with Iron level of 14 and his B12 level was also low at 202.  Started pt on oral iron tablets, stool hemoccult negative, home on prilosec 20 mg daily,  Colonoscopy scheduled for 3 days with Dr. Bosie Clos.   REcheck CBC 1 week.  Pt declined B12 injection.     Weight loss - TSH normal, dietitian started pt on Ensure BID    Dizziness - Resolved now.  likely related to significant dehydration   Diabetes mellitus - A1c - 6.1%,  Carb modified diet to be resumed.   Depression - pt to continue outpatient counseling and close follow up with primary care physician  Discharge Exam: Pt says that he feels well.  He is in no distress, He is asking to go home.   Blood pressure 105/67, pulse 100, temperature 99.6 F (37.6 C), temperature source Oral, resp. rate 18, height 6\' 1"  (1.854 m), weight 73.6 kg (162 lb 4.1 oz), SpO2 100.00%. 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 Have your colonoscopy on Wednesday as scheduled See your primary physician in 1 week to have your CBC rechecked Drink 2 Ensure drinks per day and iron rich diet  Drink plenty of water everyday  Weigh yourself daily  Medication List  As of 08/08/2011 10:55 AM   TAKE these medications         acetaminophen 500 MG tablet   Commonly known as: TYLENOL   Take 1,000 mg by mouth every 6 (six) hours as needed. pain      DSS 100 MG Caps   Take 100 mg by mouth 2 (two) times daily as needed for constipation.      ferrous sulfate 325 (65 FE) MG tablet   Take 1 tablet (325 mg total) by mouth daily with breakfast.      HYDROcodone-acetaminophen 5-500 MG per tablet   Commonly known as: VICODIN   Take 1 tablet by mouth every 6 (six) hours as needed. pain      megestrol 40 MG/ML suspension   Commonly known as: MEGACE   Take 200 mg by mouth daily.      omeprazole 20 MG capsule   Commonly known as: PRILOSEC   Take 1  capsule (20 mg total) by mouth daily.      testosterone 50 MG/5GM Gel   Commonly known as: ANDROGEL   Place 10 g onto the skin daily. 1 pump to each arm daily .      VIIBRYD 40 MG Tabs   Generic drug: Vilazodone HCl   Take 40 mg by mouth daily.      vitamin B-12 1000 MCG tablet   Commonly known as: CYANOCOBALAMIN   Take 1 tablet (1,000 mcg total) by mouth daily.           Follow-up Information    Follow up with Geraldo Pitter, MD in 1 week. [facility] Follow up, Check CBC)    Contact information:   [address] [place] [address] [phone]       Follow up with Shirley Friar., MD in 3 days. (colonoscopy, hospital follow up )    Contact information:   [address] [employer], [place]. [facility] [place] [address] [phone]         I spent 39 mins preparing discharge, reviewing records, labs, images, counseling patient, reconciling meds, writing prescriptions.  Signed: Clanford JohnsonMD 08/08/2011, 10:55 AM [facility] [place], [place] [phone]

## 2011-08-08 NOTE — Progress Notes (Signed)
Pt with some blood at tip of penis after voiding (very small amount). Urine in urinal is clear without signs of blood. Pt denies any discomfort with voiding. Will cont to monitor.

## 2011-08-22 ENCOUNTER — Encounter (HOSPITAL_COMMUNITY): Payer: Self-pay | Admitting: *Deleted

## 2011-08-22 ENCOUNTER — Inpatient Hospital Stay (HOSPITAL_COMMUNITY)
Admission: EM | Admit: 2011-08-22 | Discharge: 2011-08-24 | DRG: 378 | Disposition: A | Discharge status: Home / Self Care | Payer: Managed Care, Other (non HMO) | Attending: Internal Medicine | Admitting: Internal Medicine

## 2011-08-22 DIAGNOSIS — F329 Major depressive disorder, single episode, unspecified: Secondary | ICD-10-CM | POA: Diagnosis present

## 2011-08-22 DIAGNOSIS — D509 Iron deficiency anemia, unspecified: Secondary | ICD-10-CM | POA: Diagnosis present

## 2011-08-22 DIAGNOSIS — Z720 Tobacco use: Secondary | ICD-10-CM

## 2011-08-22 DIAGNOSIS — D649 Anemia, unspecified: Secondary | ICD-10-CM

## 2011-08-22 DIAGNOSIS — Z860100 Personal history of colon polyps, unspecified: Secondary | ICD-10-CM

## 2011-08-22 DIAGNOSIS — R197 Diarrhea, unspecified: Secondary | ICD-10-CM | POA: Diagnosis present

## 2011-08-22 DIAGNOSIS — E2740 Unspecified adrenocortical insufficiency: Secondary | ICD-10-CM

## 2011-08-22 DIAGNOSIS — E119 Type 2 diabetes mellitus without complications: Secondary | ICD-10-CM

## 2011-08-22 DIAGNOSIS — R42 Dizziness and giddiness: Secondary | ICD-10-CM

## 2011-08-22 DIAGNOSIS — F32A Depression, unspecified: Secondary | ICD-10-CM | POA: Diagnosis present

## 2011-08-22 DIAGNOSIS — D62 Acute posthemorrhagic anemia: Secondary | ICD-10-CM | POA: Diagnosis present

## 2011-08-22 DIAGNOSIS — R634 Abnormal weight loss: Secondary | ICD-10-CM | POA: Diagnosis present

## 2011-08-22 DIAGNOSIS — E7800 Pure hypercholesterolemia, unspecified: Secondary | ICD-10-CM | POA: Diagnosis present

## 2011-08-22 DIAGNOSIS — I951 Orthostatic hypotension: Secondary | ICD-10-CM

## 2011-08-22 DIAGNOSIS — E538 Deficiency of other specified B group vitamins: Secondary | ICD-10-CM

## 2011-08-22 DIAGNOSIS — K922 Gastrointestinal hemorrhage, unspecified: Principal | ICD-10-CM

## 2011-08-22 DIAGNOSIS — K219 Gastro-esophageal reflux disease without esophagitis: Secondary | ICD-10-CM | POA: Diagnosis present

## 2011-08-22 DIAGNOSIS — F17200 Nicotine dependence, unspecified, uncomplicated: Secondary | ICD-10-CM | POA: Diagnosis present

## 2011-08-22 DIAGNOSIS — F3289 Other specified depressive episodes: Secondary | ICD-10-CM | POA: Diagnosis present

## 2011-08-22 DIAGNOSIS — R55 Syncope and collapse: Secondary | ICD-10-CM

## 2011-08-22 HISTORY — DX: Tobacco use: Z72.0

## 2011-08-22 HISTORY — DX: Personal history of other endocrine, nutritional and metabolic disease: Z86.39

## 2011-08-22 HISTORY — DX: Gastro-esophageal reflux disease without esophagitis: K21.9

## 2011-08-22 HISTORY — DX: Polyp of colon: K63.5

## 2011-08-22 HISTORY — DX: Diaphragmatic hernia without obstruction or gangrene: K44.9

## 2011-08-22 LAB — URINALYSIS, MICROSCOPIC ONLY
Bilirubin Urine: NEGATIVE
Glucose, UA: NEGATIVE mg/dL
Hgb urine dipstick: NEGATIVE
Ketones, ur: NEGATIVE mg/dL
Leukocytes, UA: NEGATIVE
Nitrite: NEGATIVE
Protein, ur: NEGATIVE mg/dL
Specific Gravity, Urine: 1.012 (ref 1.005–1.030)
Urobilinogen, UA: 2 mg/dL — ABNORMAL HIGH (ref 0.0–1.0)
pH: 7 (ref 5.0–8.0)

## 2011-08-22 LAB — CBC WITH DIFFERENTIAL/PLATELET
Basophils Absolute: 0 K/uL (ref 0.0–0.1)
Basophils Relative: 0 % (ref 0–1)
Eosinophils Absolute: 0.1 K/uL (ref 0.0–0.7)
Eosinophils Relative: 1 % (ref 0–5)
HCT: 29.2 % — ABNORMAL LOW (ref 39.0–52.0)
Hemoglobin: 9.4 g/dL — ABNORMAL LOW (ref 13.0–17.0)
Lymphocytes Relative: 31 % (ref 12–46)
Lymphs Abs: 3.2 K/uL (ref 0.7–4.0)
MCH: 23.9 pg — ABNORMAL LOW (ref 26.0–34.0)
MCHC: 32.2 g/dL (ref 30.0–36.0)
MCV: 74.1 fL — ABNORMAL LOW (ref 78.0–100.0)
Monocytes Absolute: 0.5 K/uL (ref 0.1–1.0)
Monocytes Relative: 5 % (ref 3–12)
Neutro Abs: 6.4 K/uL (ref 1.7–7.7)
Neutrophils Relative %: 63 % (ref 43–77)
Platelets: 382 K/uL (ref 150–400)
RBC: 3.94 MIL/uL — ABNORMAL LOW (ref 4.22–5.81)
RDW: 19.6 % — ABNORMAL HIGH (ref 11.5–15.5)
WBC: 10.3 K/uL (ref 4.0–10.5)

## 2011-08-22 LAB — POCT I-STAT, CHEM 8
Chloride: 105 mEq/L (ref 96–112)
Glucose, Bld: 95 mg/dL (ref 70–99)
HCT: 32 % — ABNORMAL LOW (ref 39.0–52.0)
Hemoglobin: 10.9 g/dL — ABNORMAL LOW (ref 13.0–17.0)
Potassium: 3.8 mEq/L (ref 3.5–5.1)
Sodium: 143 mEq/L (ref 135–145)

## 2011-08-22 LAB — OCCULT BLOOD, POC DEVICE: Fecal Occult Bld: POSITIVE

## 2011-08-22 MED ORDER — ONDANSETRON HCL 4 MG PO TABS: 4.0000 mg | ORAL_TABLET | Freq: Four times a day (QID) | ORAL | Status: DC | PRN

## 2011-08-22 MED ORDER — NICOTINE POLACRILEX 2 MG MT GUM
2.0000 mg | CHEWING_GUM | OROMUCOSAL | Status: DC | PRN
  Administered 2011-08-22 – 2011-08-24 (×4): 2 mg via ORAL
  Filled 2011-08-22 (×5): qty 1

## 2011-08-22 MED ORDER — POLYETHYLENE GLYCOL 3350 17 G PO PACK
17.0000 g | PACK | Freq: Every day | ORAL | Status: DC | PRN
  Filled 2011-08-22: qty 1

## 2011-08-22 MED ORDER — FLUDROCORTISONE ACETATE 0.1 MG PO TABS
0.1000 mg | ORAL_TABLET | Freq: Every morning | ORAL | Status: DC
  Administered 2011-08-23 – 2011-08-24 (×2): 0.1 mg via ORAL
  Filled 2011-08-22 (×2): qty 1

## 2011-08-22 MED ORDER — ENOXAPARIN SODIUM 40 MG/0.4ML ~~LOC~~ SOLN
40.0000 mg | SUBCUTANEOUS | Status: DC
Start: 2011-08-22 — End: 2011-08-23
  Filled 2011-08-22 (×2): qty 0.4

## 2011-08-22 MED ORDER — VITAMIN B-12 1000 MCG PO TABS
1000.0000 ug | ORAL_TABLET | Freq: Every day | ORAL | Status: DC
  Administered 2011-08-22 – 2011-08-24 (×3): 1000 ug via ORAL
  Filled 2011-08-22 (×4): qty 1

## 2011-08-22 MED ORDER — SODIUM CHLORIDE 0.9 % IV BOLUS (SEPSIS)
1000.0000 mL | Freq: Once | INTRAVENOUS | Status: AC
  Administered 2011-08-22: 1000 mL via INTRAVENOUS

## 2011-08-22 MED ORDER — ACETAMINOPHEN 500 MG PO TABS
1000.0000 mg | ORAL_TABLET | Freq: Four times a day (QID) | ORAL | Status: DC | PRN
  Filled 2011-08-22: qty 2

## 2011-08-22 MED ORDER — SODIUM CHLORIDE 0.9 % IV SOLN
INTRAVENOUS | Status: DC
  Administered 2011-08-22 – 2011-08-23 (×2): via INTRAVENOUS
  Administered 2011-08-23: 500 mL via INTRAVENOUS

## 2011-08-22 MED ORDER — MEGESTROL ACETATE 400 MG/10ML PO SUSP
625.0000 mg | Freq: Every day | ORAL | Status: DC
  Administered 2011-08-22 – 2011-08-24 (×3): 625 mg via ORAL
  Filled 2011-08-22 (×3): qty 20

## 2011-08-22 MED ORDER — ONDANSETRON HCL 4 MG/2ML IJ SOLN: 4.0000 mg | Freq: Four times a day (QID) | INTRAMUSCULAR | Status: DC | PRN

## 2011-08-22 MED ORDER — TESTOSTERONE 50 MG/5GM (1%) TD GEL
5.0000 g | Freq: Every day | TRANSDERMAL | Status: DC
  Administered 2011-08-22 – 2011-08-24 (×2): 5 g via TRANSDERMAL
  Filled 2011-08-22 (×2): qty 5

## 2011-08-22 MED ORDER — MEGESTROL ACETATE 625 MG/5ML PO SUSP: 625.0000 mg | Freq: Every day | ORAL | Status: DC

## 2011-08-22 MED ORDER — TESTOSTERONE 20.25 MG/1.25GM (1.62%) TD GEL: 2.0000 "application " | Freq: Every day | TRANSDERMAL | Status: DC

## 2011-08-22 MED ORDER — FERROUS SULFATE 325 (65 FE) MG PO TABS
325.0000 mg | ORAL_TABLET | Freq: Every day | ORAL | Status: DC
  Administered 2011-08-23 – 2011-08-24 (×2): 325 mg via ORAL
  Filled 2011-08-22 (×3): qty 1

## 2011-08-22 MED ORDER — PANTOPRAZOLE SODIUM 40 MG PO TBEC: 40.0000 mg | DELAYED_RELEASE_TABLET | Freq: Every day | ORAL | Status: DC

## 2011-08-22 MED ORDER — ADULT MULTIVITAMIN W/MINERALS CH
1.0000 | ORAL_TABLET | Freq: Every day | ORAL | Status: DC
  Administered 2011-08-22 – 2011-08-24 (×3): 1 via ORAL
  Filled 2011-08-22 (×3): qty 1

## 2011-08-22 MED ORDER — SODIUM CHLORIDE 0.9 % IJ SOLN
3.0000 mL | Freq: Two times a day (BID) | INTRAMUSCULAR | Status: DC
  Administered 2011-08-24: 3 mL via INTRAVENOUS

## 2011-08-22 MED ORDER — ALUM & MAG HYDROXIDE-SIMETH 200-200-20 MG/5ML PO SUSP: 30.0000 mL | Freq: Four times a day (QID) | ORAL | Status: DC | PRN

## 2011-08-22 NOTE — ED Notes (Addendum)
Pt reports almost everyday for the last couple weeks pt has had dizziness when he stands up. Denies any syncopal episodes reports just near syncope. Denies chest pain or sob associated with symptoms.

## 2011-08-22 NOTE — ED Notes (Addendum)
C/o intermittent episodes of lightheadedness & dizziness with position changes x 3 weeks. States has had 4 syncopal episodes since then. Was seen here beginning of Aug for same. C/o diarrhea x 3 weeks average 4 times a week. Reports had a colonoscopy Aug 7th & had polyps removed. Denies n/v, palpitations, CP.

## 2011-08-22 NOTE — ED Provider Notes (Signed)
I saw and evaluated the patient, reviewed the resident's note and I agree with the findings and plan.   I reivewed ECG and agree with resident interpretation.    Pt is orthostatic profoundly, currently hemoconcentration, meaning true Hgb reflection may be in the 7 range, failing outpt therapy at this time, would benefit from admission.  No resp distress, sats ok.  On monitor, resting comfortably, no pain at the moment.    Gavin Pound. Oletta Lamas, MD 08/22/11 0454

## 2011-08-22 NOTE — H&P (Signed)
[facility] History and Physical  Frederick Maddox ZOX:[id number] DOB: [birth year] DOA: 08/22/2011  Referring physician: Dr. Earlene Plater PCP: Geraldo Pitter, MD   Chief Complaint: Blood pressure dropping when I stand up  HPI:  The patient is a 46 year old male with history of DM dx 2 years ago, depression, tobacco use, and GERD who presents with lightheadedness while sitting up or standing.  He has been ill since two years ago when he was diagnosed with DM after which time he lost almost 100lbs which he attributes to uncontrolled depression and not eating.  About two months ago, he was seen by his primary care doctor who was concerned about abdominal cramps in the setting of weight loss and referred him for colonoscopy.  Six weeks ago, he started having episodes of lightheadedness/presyncope requiring him to lie down.  He was admitted to [facility] 8/2 after he progressed to a syncopal episode.  He was found to be anemia with iron and B12 deficiency and started on iron and B12 PO.  He underwent EGD and colonoscopy which revealed blood clot in the stomach and two colonic polyps which were resected.  Since then, he has had progressive lightheadedness and dizziness with sitting and standing.  His aunt has been taking his blood pressures, which were reviewed and reveal >31mmHg drop in BP and > 20bpm increase in HR.  His PCP prescribed megace and florinef, which he started taking two days ago.  This morning, he felt lightheaded even lying in bed.  He came to the ER because he was unable to complete ADLS.    In the ER, he was orthostatic and given 2L of IVF.  His hemoglobin has recovered somewhat from his previous admission.     Review of Systems:  Chills, but no fever, no night sweats, no nausea, vomiting.  Diarrhea 4 times per week and twice it is really watery and other times soft like pudding.  Occasionally has a more formed stool, about once every five to six stools, but no visible blood and no black  tarry stools.  Urinates a lot, but denies dysuria.  Denies lymphanopathy, abnormal bruising, and depression is currently well controlled.    Past Medical History  Diagnosis Date  . Diabetes mellitus   . Depression   . Colonic polyp August 2013    Two removed, pathology pending  . Hiatal hernia   . GERD (gastroesophageal reflux disease)   . Tobacco use   . H/O hypotestosteronemia    Past Surgical History  Procedure Date  . Polypectomy    Social History:  reports that he has been smoking Cigarettes.  He has been smoking about .5 packs per day. He does not have any smokeless tobacco history on file. He reports that he drinks about .5 ounces of alcohol per week. He reports that he does not use illicit drugs. Lives alone but he has been staying with your aunt who is helping him by getting him to eat.  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    No Known Allergies  Family History  Problem Relation Age of Onset  . Diabetes type II Mother     many family members  . Hypertension Brother     many family members  . Breast cancer Maternal Grandmother   . Breast cancer Cousin   . Brain cancer Maternal Grandmother     Prior to Admission medications   Medication Sig Start Date End Date Taking? Authorizing Provider  acetaminophen (TYLENOL) 500 MG tablet Take 1,000 mg by mouth every 6 (six) hours as needed. pain   Yes Historical Provider, MD  docusate sodium (COLACE) 100 MG capsule Take 100 mg by mouth daily.   Yes Historical Provider, MD  ferrous sulfate 325 (65 FE) MG tablet Take 325 mg by mouth daily with breakfast. 08/08/11 08/07/12 Yes Clanford Cyndie Mull, MD  fludrocortisone (FLORINEF) 0.1 MG tablet Take 0.1 mg by mouth every morning.   Yes Historical Provider, MD  HYDROcodone-acetaminophen (VICODIN) 5-500 MG per tablet Take 1 tablet by mouth every 6 (six) hours as needed. pain   Yes Historical Provider, MD  megestrol (MEGACE ES) 625 MG/5ML  suspension Take 625 mg by mouth daily.   Yes Historical Provider, MD  omeprazole (PRILOSEC) 20 MG capsule Take 20 mg by mouth daily. 08/08/11 08/07/12 Yes Clanford Cyndie Mull, MD  Testosterone (ANDROGEL) 20.25 MG/1.25GM (1.62%) GEL Place 2 application onto the skin daily. 2 pump each day to upper arm and chest (1 to left side, 1 to right side)   Yes Historical Provider, MD  Vilazodone HCl (VIIBRYD) 40 MG TABS Take 40 mg by mouth daily.   Yes Historical Provider, MD  vitamin B-12 (CYANOCOBALAMIN) 1000 MCG tablet Take 1,000 mcg by mouth daily. 08/08/11 08/07/12 Yes Clanford Cyndie Mull, MD   Physical Exam: Filed Vitals:   08/22/11 1730 08/22/11 1830 08/22/11 1930 08/22/11 2000  BP: 118/77 123/82 118/77 119/81  Pulse: 96 93 88 97  Temp:      TempSrc:      Resp: 20 20 21 12   SpO2: 100% 100% 100% 100%     General:  Cachectic AAM, no acute distress, lying in bed  Eyes: PERRL, anicteric, noninjected  ENT: nares and OP clear  Neck: supple, no discernable JVP  Cardiovascular: RRR, no murmurs, rubs, gallops, or skipped beats.    Respiratory: CTAB  Abdomen: NABS, soft, nondistended, nontender, no organomegaly  Skin: Scar on left shin  Musculoskeletal: Normal tone and bulk  Psychiatric: A&O x 4, normal affect  Neurologic: II-XII intact, strength 5/5 throughout, sensation intact to light touch, no dysmetria  Lymph:  No cervical or supraclavicular LAD  Labs on Admission:  Basic Metabolic Panel:  Lab 08/22/11 1610  NA 143  K 3.8  CL 105  CO2 --  GLUCOSE 95  BUN 7  CREATININE 0.90  CALCIUM --  MG --  PHOS --   Liver Function Tests: No results found for this basename: AST:5,ALT:5,ALKPHOS:5,BILITOT:5,PROT:5,ALBUMIN:5 in the last 168 hours No results found for this basename: LIPASE:5,AMYLASE:5 in the last 168 hours No results found for this basename: AMMONIA:5 in the last 168 hours CBC:  Lab 08/22/11 1322 08/22/11 1310  WBC -- 10.3  NEUTROABS -- 6.4  HGB 10.9* 9.4*  HCT 32.0*  29.2*  MCV -- 74.1*  PLT -- 382   Cardiac Enzymes: No results found for this basename: CKTOTAL:5,CKMB:5,CKMBINDEX:5,TROPONINI:5 in the last 168 hours  BNP (last 3 results) No results found for this basename: PROBNP:3 in the last 8760 hours CBG:  Lab 08/22/11 1444  GLUCAP 99    Radiological Exams on Admission: No results found.  EKG: Independently reviewed. Sinus tachycardia  Assessment/Plan Principal Problem:  *Orthostatic hypotension Active Problems:  Weight loss  Diabetes mellitus  Depression  Iron deficiency anemia  B12 deficiency  Frequent loose stools  Orthostatic hypotension:  Differential includes dysrhythmia, CHF, adrenal insufficiency, familial dysautonomia, POTS, autoimmune autonomic ganglionopathy (particularly given the concurrent GI symptoms).  Most likely, patient has a degree  of dehydration with either adrenal insufficiency or POTS.  He has not had longstanding diabetes and his A1c is well controlled.  No symptoms of CHF and no cardiomegaly on recent CXR 8/2.  B12 deficiency may also cause orthostasis.  -  AM cortisol level -  Telemetry -  OOB only with assistance -  Fall precautions -  PT consult -  Cardiology consult -   Continue florinef (rx empirically by PCP)  Weight loss:  Likely due to depression, but differential includes malabsorptive disorders such as celiac, malignancy, and certain infections, such as HIV and TB -   Anti-tTG IgA, IgA -   HIV -   Quantiferon gold -   Continue Megace  Loose stools:  DDx includes IBS, IBD, celiac, and less likely infection.  Per patient, no evidence of celiac or crohn's on recent scopes.   -   Celiac titers as above -   Hold colace  Anemia, improving -   Continue B12 and iron  B12 deficiency:  Likely due to poor diet vs. Malabsorption -    Consider anti-IF and anti-parietal cell as outpatient  GERD:  Stable. Continue Omeprazole  Depression, stable, hold home medication (not on formulary)  DIET:   Diabetic ACCESS:  PIV PROPH:  Lovenox  Code Status: Full Family Communication: Plan discussed with patient, aunt, and brother Disposition Plan: Pending results of cortisol   Time spent: 69  Renae Fickle [facility] Pager [phone]  If 7PM-7AM, please contact night-coverage [email or web address] Password [email or web address] 08/22/2011, 8:11 PM

## 2011-08-22 NOTE — ED Provider Notes (Signed)
History     CSN: [id number]  Arrival date & time 08/22/11  1252   None     Chief Complaint  Patient presents with  . Dizziness  . Hypotension    HPI: This is a 46 year old man with a history of anemia, presenting with syncope, near-syncope, and orthostatic hypotension.  He has been having dizziness and syncope for over a month now and was hospitalized at [facility] early in August for this.  Diagnosis then was anemia and hypovolemia.  Colonoscopy shortly after this hospitalization demonstrated two colonic polyps that were removed and some blood in the stomach.  Since this hospitalization, he has passed out 4 times and fallen down because of lower extremity weakness 10 times.  He has had loose, watery stools for about 3 weeks, but has been on scheduled docusate since his colonoscopy.  He reports increased nocturia, urinating 3 to 4 times a night, but no increased urination during the day.  He denies weak stream, dribbling, incomplete emptying, or difficulty initiating micturition.  Patient has lost about 60 pounds over the past year.  This has recently leveled off around 160lbs.  His appetite is currently very good, and the depression he previous suffered from is under control.  The syncopal episodes occur when the patient goes from lying or sitting to standing.  There are no warning signs; he has no changes in vision, no preceding lightheadedness or signs of impending syncope.  He blacks out and his next recollections are family members waking him up.  The pre-syncopal episodes are described as weakness in his lower extremities eventually leading to collapse, but no LOC.  He was seen by his PCP 2 days ago and was started on fludrocortisone.  He denies headaches, changes in vision or hearing, recent sickness, congestion, cough, dyspnea, chest pain, dysuria, hematuria, hematochezia, melena, rashes or arthralgias.   Past Medical History  Diagnosis Date  . Diabetes mellitus   . Depression   . Colonic  polyp August 2013    Two removed, pathology pending    History reviewed. No pertinent past surgical history.  Family History  Problem Relation Age of Onset  . Diabetes type II Mother   . Hypertension    . Breast cancer      History  Substance Use Topics  . Smoking status: Current Everyday Smoker  . Smokeless tobacco: Not on file  . Alcohol Use: No      Review of Systems  All other systems reviewed and are negative.    Allergies  Review of patient's allergies indicates no known allergies.  Home Medications   Current Outpatient Rx  Name Route Sig Dispense Refill  . ACETAMINOPHEN 500 MG PO TABS Oral Take 1,000 mg by mouth every 6 (six) hours as needed. pain    . DOCUSATE SODIUM 100 MG PO CAPS Oral Take 100 mg by mouth daily.    Marland Kitchen FERROUS SULFATE 325 (65 FE) MG PO TABS Oral Take 325 mg by mouth daily with breakfast.    . FLUDROCORTISONE ACETATE 0.1 MG PO TABS Oral Take 0.1 mg by mouth every morning.    Marland Kitchen HYDROCODONE-ACETAMINOPHEN 5-500 MG PO TABS Oral Take 1 tablet by mouth every 6 (six) hours as needed. pain    . MEGESTROL ACETATE 625 MG/5ML PO SUSP Oral Take 625 mg by mouth daily.    Marland Kitchen OMEPRAZOLE 20 MG PO CPDR Oral Take 20 mg by mouth daily.    . TESTOSTERONE 20.25 MG/1.25GM (1.62%) TD GEL Transdermal Place  2 application onto the skin daily. 2 pump each day to upper arm and chest (1 to left side, 1 to right side)    . VILAZODONE HCL 40 MG PO TABS Oral Take 40 mg by mouth daily.    Marland Kitchen VITAMIN B-12 1000 MCG PO TABS Oral Take 1,000 mcg by mouth daily.      BP 105/64  Pulse 103  Temp 99 F (37.2 C) (Oral)  Resp 20  SpO2 100%  Physical Exam  Constitutional: He appears well-developed and well-nourished. He does not appear ill. No distress.  HENT:  Head: Normocephalic and atraumatic.  Mouth/Throat: Uvula is midline, oropharynx is clear and moist and mucous membranes are normal.  Eyes: EOM are normal. Pupils are equal, round, and reactive to light.  Neck: Neck supple.  Carotid bruit is not present. No mass and no thyromegaly present.  Cardiovascular: Normal rate, regular rhythm and normal heart sounds.   No murmur heard. Pulmonary/Chest: Effort normal and breath sounds normal.  Abdominal: Soft. Bowel sounds are normal. He exhibits no mass. There is no hepatosplenomegaly. There is generalized tenderness. There is no rigidity, no rebound and no guarding.  Musculoskeletal: Normal range of motion.  Lymphadenopathy:    He has no cervical adenopathy.  Neurological: He has normal strength. No cranial nerve deficit or sensory deficit. Gait normal.  Skin: Skin is warm, dry and intact.  Psychiatric: He has a normal mood and affect. His speech is normal and behavior is normal. Judgment and thought content normal.    ED Course  Procedures (including critical care time)  3:54 PM - Orthostatic vital signs: 112/93, 88 (lying); 119/71, 106 (sitting); 86/50 120 (standing).  All at 5 minute intervals.  5:41 PM - Hemodynamically stable. FOBT +.  7:08 PM - Spoke with hospitalist.  They will be seeing patient shortly.  Labs Reviewed  CBC WITH DIFFERENTIAL - Abnormal; Notable for the following:    RBC 3.94 (*)     Hemoglobin 9.4 (*)     HCT 29.2 (*)     MCV 74.1 (*)     MCH 23.9 (*)     RDW 19.6 (*)     All other components within normal limits  POCT I-STAT, CHEM 8 - Abnormal; Notable for the following:    Calcium, Ion 1.30 (*)     Hemoglobin 10.9 (*)     HCT 32.0 (*)     All other components within normal limits  URINALYSIS, WITH MICROSCOPIC - Abnormal; Notable for the following:    Urobilinogen, UA 2.0 (*)     All other components within normal limits  GLUCOSE, CAPILLARY  OCCULT BLOOD, POC DEVICE  OCCULT BLOOD X 1 CARD TO LAB, STOOL   EKG Results: Rate:  108 PR:  194 QRS:  82 QTc:  436 EKG: normal EKG, normal sinus rhythm, unchanged from previous tracings.  No results found.   1. Orthostatic hypotension   2. Anemia       MDM  1.    Orthostatic hypotension:  DDx includes hypovolemia, chronic adrenal insufficiency, and autonomic dysfunction.  His sodium was borderline normal earlier in August, and now it is 143 after starting fludrocortisone 3 days ago.  He has also had a 60 pound, unintended weight loss over the past year that would support autonomic dysfunction, though this could be from his depression as well.  Autonomic dysfunction is less likely but certainly still possible.  I think it is unlikely that he is hypovolemic from uncontrolled diabetes as  his last A1c was 5.8 and the highest CBG at home has been in the 130s.  Also, these syncopal episodes do not sound like hypoglycemic episodes.  His loose stool is likely secondary to docusate use despite loose stools.  I don't believe this history of diarrhea is causing him to be hypovolemic either.  2.   Anemia:  Acute on chronic from GI blood loss.  FOBT still positive, but polyp removal was earlier this month.  Unlikely to be the sole contributor to #1, but it may be contributing to the syncopal episodes.  3.   Syncopal episode:  EKG normal.  No concerning features to suggest seizure.  Likely multifactorial with #1 & #2 contributing.  4.  Disposition:  Admit to hospital given the numerous syncopal episodes this patient has experienced with persistent hypotension.  Lollie Sails, MD 08/22/11 Windell Moment

## 2011-08-23 ENCOUNTER — Encounter (HOSPITAL_COMMUNITY): Payer: Self-pay | Admitting: *Deleted

## 2011-08-23 ENCOUNTER — Encounter (HOSPITAL_COMMUNITY)
Admission: EM | Disposition: A | Discharge status: Home / Self Care | Payer: Self-pay | Source: Home / Self Care | Attending: Internal Medicine

## 2011-08-23 DIAGNOSIS — D509 Iron deficiency anemia, unspecified: Secondary | ICD-10-CM

## 2011-08-23 DIAGNOSIS — K922 Gastrointestinal hemorrhage, unspecified: Secondary | ICD-10-CM

## 2011-08-23 HISTORY — PX: ESOPHAGOGASTRODUODENOSCOPY: SHX5428

## 2011-08-23 HISTORY — PX: COLONOSCOPY: SHX5424

## 2011-08-23 LAB — OCCULT BLOOD X 1 CARD TO LAB, STOOL: Fecal Occult Bld: NEGATIVE

## 2011-08-23 LAB — BASIC METABOLIC PANEL
CO2: 25 mEq/L (ref 19–32)
Calcium: 9.6 mg/dL (ref 8.4–10.5)
GFR calc Af Amer: >90 mL/min (ref >90)
GFR calc non Af Amer: >90 mL/min (ref >90)
Sodium: 141 mEq/L (ref 135–145)

## 2011-08-23 LAB — CBC
Hemoglobin: 7.9 g/dL — ABNORMAL LOW (ref 13.0–17.0)
MCV: 73.7 fL — ABNORMAL LOW (ref 78.0–100.0)
Platelets: 353 10*3/uL (ref 150–400)
RBC: 3.35 MIL/uL — ABNORMAL LOW (ref 4.22–5.81)
RBC: 3.31 MIL/uL — ABNORMAL LOW (ref 4.22–5.81)
WBC: 8.8 10*3/uL (ref 4.0–10.5)
WBC: 9.2 10*3/uL (ref 4.0–10.5)

## 2011-08-23 LAB — HEMOGLOBIN AND HEMATOCRIT, BLOOD: HCT: 29.1 % — ABNORMAL LOW (ref 39.0–52.0)

## 2011-08-23 LAB — CORTISOL-AM, BLOOD: Cortisol - AM: 1.2 ug/dL — ABNORMAL LOW (ref 4.3–22.4)

## 2011-08-23 LAB — PREPARE RBC (CROSSMATCH)

## 2011-08-23 SURGERY — EGD (ESOPHAGOGASTRODUODENOSCOPY)
Anesthesia: Moderate Sedation

## 2011-08-23 MED ORDER — MIDAZOLAM HCL 5 MG/ML IJ SOLN
INTRAMUSCULAR | Status: AC
  Filled 2011-08-23: qty 1

## 2011-08-23 MED ORDER — FENTANYL CITRATE 0.05 MG/ML IJ SOLN
INTRAMUSCULAR | Status: DC | PRN
  Administered 2011-08-23 (×2): 25 ug via INTRAVENOUS

## 2011-08-23 MED ORDER — BUTAMBEN-TETRACAINE-BENZOCAINE 2-2-14 % EX AERO
INHALATION_SPRAY | CUTANEOUS | Status: DC | PRN
  Administered 2011-08-23: 2 via TOPICAL

## 2011-08-23 MED ORDER — MIDAZOLAM HCL 10 MG/2ML IJ SOLN: INTRAMUSCULAR | Status: DC | PRN

## 2011-08-23 MED ORDER — DIPHENHYDRAMINE HCL 50 MG/ML IJ SOLN
INTRAMUSCULAR | Status: AC
  Filled 2011-08-23: qty 1

## 2011-08-23 MED ORDER — DIPHENHYDRAMINE HCL 50 MG/ML IJ SOLN
INTRAMUSCULAR | Status: DC | PRN
  Administered 2011-08-23: 25 mg via INTRAVENOUS

## 2011-08-23 MED ORDER — COSYNTROPIN 0.25 MG IJ SOLR
0.2500 mg | Freq: Once | INTRAMUSCULAR | Status: AC
  Administered 2011-08-24: 0.25 mg via INTRAVENOUS
  Filled 2011-08-23: qty 0.25

## 2011-08-23 MED ORDER — MIDAZOLAM HCL 5 MG/ML IJ SOLN
INTRAMUSCULAR | Status: AC
  Filled 2011-08-23: qty 4

## 2011-08-23 MED ORDER — MAGNESIUM HYDROXIDE 400 MG/5ML PO SUSP
30.0000 mL | Freq: Every day | ORAL | Status: DC
  Administered 2011-08-23: 30 mL via ORAL
  Filled 2011-08-23 (×2): qty 30

## 2011-08-23 MED ORDER — PANTOPRAZOLE SODIUM 40 MG IV SOLR
40.0000 mg | Freq: Two times a day (BID) | INTRAVENOUS | Status: DC
  Administered 2011-08-23 – 2011-08-24 (×3): 40 mg via INTRAVENOUS
  Filled 2011-08-23 (×4): qty 40

## 2011-08-23 MED ORDER — MIDAZOLAM HCL 5 MG/5ML IJ SOLN
INTRAMUSCULAR | Status: DC | PRN
  Administered 2011-08-23: 1 mg via INTRAVENOUS
  Administered 2011-08-23 (×2): 2 mg via INTRAVENOUS

## 2011-08-23 MED ORDER — FENTANYL CITRATE 0.05 MG/ML IJ SOLN
INTRAMUSCULAR | Status: AC
  Filled 2011-08-23: qty 4

## 2011-08-23 NOTE — Progress Notes (Signed)
PT CANCELLATION NOTE:  08/23/2011  PT evaluation cancelled due to pt not medically ready.  Pt Hgb low (7.9) and RN reports pt continues to become dizzy with activity.  Pt to receive transfusion of PRBCs today.  Acute PT will follow up tomorrow.  Glendale Youngblood L. Mily Malecki DPT [phone]

## 2011-08-23 NOTE — Progress Notes (Addendum)
[facility] PROGRESS NOTE  TRESHAWN ALLEN WUJ:[id number] DOB: 1965-01-07 DOA: 08/22/2011 PCP: Geraldo Pitter, MD  Assessment/Plan: Principal Problem:  *Orthostatic hypotension Active Problems:  Weight loss  Diabetes mellitus  Depression  Iron deficiency anemia  B12 deficiency  Frequent loose stools  Anemia, acute/GIB.  No evidence of hemolysis and patient has not started any new medications.  Known GIB from earlier this month and recently EGD/colo by Dr. Bosie Clos.  Hgb dropped 3 points today (with possibly some decrease from hemodilution).  Rectal exam with brown stool, sent again for hemoccult, but was positive yesterday.  Likely nonvariceal UGIB.[facility] called for consultation -  GI consult -  Will check CBC at 1700 (q6h) -  Transfuse 1 unit PRBC -  Continue B12 and iron  -  NPO with IVF -  Protonix 40mg  IV q12h >> Spoke with [facility].  Patient NPO for EGD and possible sigmoidoscopy this afternoon.  Orthostatic hypotension: Differential includes dysrrhythmia, CHF, adrenal insufficiency, familial dysautonomia, POTS, autoimmune autonomic ganglionopathy (particularly given the concurrent GI symptoms). Most likely, patient has a degree of dehydration with either adrenal insufficiency or POTS. He has not had longstanding diabetes and his A1c is well controlled. No symptoms of CHF and no cardiomegaly on recent CXR 8/2. B12 deficiency may also cause orthostasis.  Acute bleeding may also be contributing. -  See above for GIB management - f/u AM cortisol level  - Telemetry  - OOB only with assistance  - Fall precautions  - PT consult  - Cardiology consult pending work up of adrenal insufficiency and GI consultation  - Continue florinef (rx empirically by PCP)   Weight loss: Likely due to depression, but differential includes malabsorptive disorders such as celiac, malignancy, and certain infections, such as HIV and TB  - Anti-tTG IgA, IgA  - HIV  - Quantiferon  gold  - Continue Megace   Loose stools: DDx includes IBS, IBD, celiac, and less likely infection. Per patient, no evidence of celiac or crohn's on recent scopes.  - Celiac titers as above  - Hold colace   B12 deficiency: Likely due to poor diet vs. Malabsorption  - Consider anti-IF and anti-parietal cell as outpatient   GERD: Stable. Continue Omeprazole  Depression, stable, hold home medication (not on formulary)   DIET: NPO with IVF 0.9 NS at 143ml/h ACCESS: PIV  PROPH: Lovenox  Code Status: Full  Family Communication: Plan discussed with patient, aunt, and brother  Disposition Plan: Pending results of cortisol    Brief narrative: The patient is a 46 year old male with history of DM dx 2 years ago, depression, tobacco use, and GERD who presents with lightheadedness while sitting up or standing. He has been ill since two years ago when he was diagnosed with DM after which time he lost almost 100lbs which he attributes to uncontrolled depression and not eating. About two months ago, he was seen by his primary care doctor who was concerned about abdominal cramps in the setting of weight loss and referred him for colonoscopy. Six weeks ago, he started having episodes of lightheadedness/presyncope requiring him to lie down. He was admitted to [facility] 8/2 after he progressed to a syncopal episode. He was found to be anemia with iron and B12 deficiency and started on iron and B12 PO. He underwent EGD and colonoscopy which revealed blood clot in the stomach and two colonic polyps which were resected. Since then, he has had progressive lightheadedness and dizziness with sitting and standing.  His aunt has been taking his blood pressures, which were reviewed and reveal >14mmHg drop in BP and > 20bpm increase in HR. His PCP prescribed megace and florinef, which he started taking two days ago. This morning, he felt lightheaded even lying in bed. He came to the ER because he was unable to complete ADLS.    In the ER, he was orthostatic and given 2L of IVF. His hemoglobin has recovered somewhat from his previous admission.    Consultants:  GI  Procedures:  none  Antibiotics:  none  HPI/Subjective:  Patient states that he continues to feel lightheaded when even rolling over in bed, although without room spinning sensation.  He feels woozy, he states.  He denies indigestion, heartburn, chest pain, hemoptysis, hematemesis.  He has not had a BM since he has been here.  No other abnormal brusiing or bleeding.  Denies nausea, vomiting, fevers.    Objective: Filed Vitals:   08/22/11 1930 08/22/11 2000 08/22/11 2125 08/23/11 0500  BP: 118/77 119/81 101/69 119/75  Pulse: 88 97 88 73  Temp:   99.2 F (37.3 C) 99.1 F (37.3 C)  TempSrc:      Resp: 21 12 18 18   Height:   6\' 1"  (1.854 m)   Weight:   73.9 kg (162 lb 14.7 oz)   SpO2: 100% 100% 100% 100%    Intake/Output Summary (Last 24 hours) at 08/23/11 1210 Last data filed at 08/23/11 1011  Gross per 24 hour  Intake 1617.5 ml  Output   2275 ml  Net -657.5 ml   Filed Weights   08/22/11 2125  Weight: 73.9 kg (162 lb 14.7 oz)    Exam:   General:  AAM, sitting up in bed, no acute distress  HEENT:  No obvious blood in mouth or nares  Cardiovascular: RRR, no murmurs, rubs or gallops  Respiratory: CTAB  Abdomen: Mild tenderness of the left quadrants without rebound or guarding.    Rectal exam:  Small amount of mucousy brown stool without gross blood in vault.  Skin:  No petechiae  Data Reviewed: Basic Metabolic Panel:  Lab 08/23/11 5621 08/22/11 1322  NA 141 143  K 3.6 3.8  CL 107 105  CO2 25 --  GLUCOSE 97 95  BUN 6 7  CREATININE 0.75 0.90  CALCIUM 9.6 --  MG -- --  PHOS -- --   Liver Function Tests: No results found for this basename: AST:5,ALT:5,ALKPHOS:5,BILITOT:5,PROT:5,ALBUMIN:5 in the last 168 hours No results found for this basename: LIPASE:5,AMYLASE:5 in the last 168 hours No results found for  this basename: AMMONIA:5 in the last 168 hours CBC:  Lab 08/23/11 1012 08/23/11 0730 08/22/11 1322 08/22/11 1310  WBC 9.2 8.8 -- 10.3  NEUTROABS -- -- -- 6.4  HGB 7.9* 7.9* 10.9* 9.4*  HCT 24.4* 24.7* 32.0* 29.2*  MCV 73.7* 73.7* -- 74.1*  PLT 362 353 -- 382   Cardiac Enzymes: No results found for this basename: CKTOTAL:5,CKMB:5,CKMBINDEX:5,TROPONINI:5 in the last 168 hours BNP (last 3 results) No results found for this basename: PROBNP:3 in the last 8760 hours CBG:  Lab 08/22/11 1444  GLUCAP 99    No results found for this or any previous visit (from the past 240 hour(s)).   Studies: Dg Chest 2 View  08/06/2011  *RADIOLOGY REPORT*  Clinical Data: Hypotension.  Weight loss  CHEST - 2 VIEW  Comparison:  None.  Findings:  The heart size and mediastinal contours are within normal limits.  Both lungs are clear.  The visualized skeletal structures are unremarkable.  IMPRESSION: No active cardiopulmonary disease.  Original Report Authenticated By: Camelia Phenes, M.D.   Dg Knee 1-2 Views Right  08/06/2011  *RADIOLOGY REPORT*  Clinical Data: Fall with right knee pain.  RIGHT KNEE - 1-2 VIEW  Comparison: 06/24/2011  Findings: No evidence of acute fracture, subluxation or dislocation identified.  A very small knee effusion is present.  No radio-opaque foreign bodies are present.  No focal bony lesions are noted.  The joint spaces are unremarkable.  IMPRESSION: Very small knee effusion without acute bony abnormality.  Original Report Authenticated By: Rosendo Gros, M.D.   Ct Abdomen Pelvis W Contrast  08/03/2011  **ADDENDUM** CREATED: 08/03/2011 15:46:53  This is a correction to the previously dictated report.  In the original dictation the clinical data was mistakenly reported as "melanoma".  This was a voice recognition error that was not identified at the time of the original dictation.  This should have read, "melena."  **END ADDENDUM** SIGNED BY: Florencia Reasons, M.D.   07/29/2011   *RADIOLOGY REPORT*  Clinical Data: 60 pounds of weight loss since October 2012. Diarrhea and constipation.  Melanoma.  CT ABDOMEN AND PELVIS WITH CONTRAST  Technique:  Multidetector CT imaging of the abdomen and pelvis was performed following the standard protocol during bolus administration of intravenous contrast.  Contrast:  100 ml of Omnipaque-300.  Comparison: No priors.  Findings:  Lung Bases: Unremarkable.  Abdomen/Pelvis:  The enhanced appearance of the liver, gallbladder, pancreas, spleen, bilateral adrenal glands and bilateral kidneys is unremarkable. No ascites or pneumoperitoneum and no definite pathologic distension of bowel.  Multiple borderline loops of mid small bowel are noted.  There is gas and stool, as well as oral contrast material, throughout the colon.  No definite pathologic lymphadenopathy identified in the abdomen or pelvis.  Prostate and urinary bladder are unremarkable in appearance.  Musculoskeletal: There are no aggressive appearing lytic or blastic lesions noted in the visualized portions of the skeleton.  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  IMPRESSION: 1.  No suspicious findings in the abdomen or pelvis to account the patient's history of weight loss. 2.  Multiple borderline dilated loops of bowel are nonspecific.  No evidence of frank bowel obstruction at this time. 3.  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  Original Report Authenticated By: Florencia Reasons, M.D.    Scheduled Meds:   . ferrous sulfate  325 mg Oral Q breakfast  . fludrocortisone  0.1 mg Oral q morning - 10a  . megestrol  625 mg Oral Daily  . multivitamin with minerals  1 tablet Oral Daily  . pantoprazole (PROTONIX) IV  40 mg Intravenous Q12H  . sodium chloride  1,000 mL Intravenous Once  . sodium  chloride  1,000 mL Intravenous Once  . sodium chloride  3 mL Intravenous Q12H  . testosterone  5 g Transdermal Daily  . vitamin B-12  1,000 mcg Oral Daily  . DISCONTD: enoxaparin (LOVENOX) injection  40 mg Subcutaneous Q24H  . DISCONTD: megestrol  625 mg Oral Daily  . DISCONTD: pantoprazole  40 mg Oral Q1200  . DISCONTD: Testosterone  2 application Transdermal Daily   Continuous Infusions:   . sodium chloride 150 mL/hr at 08/23/11 1033    Principal Problem:  *Orthostatic hypotension Active Problems:  Weight loss  Diabetes mellitus  Depression  Iron deficiency anemia  B12 deficiency  Frequent loose stools    Time spent: 45    Daric Koren, [facility]  [facility] Pager [phone]. If 8PM-8AM, please contact night-coverage at [email or web address], password [facility] 08/23/2011, 12:10 PM  LOS: 1 day

## 2011-08-23 NOTE — Op Note (Signed)
[facility] [address] [place], [address]   ENDOSCOPY PROCEDURE REPORT  PATIENT: [name], [name]  MR#: [id number] BIRTHDATE: 30-Apr-1965 , 46  yrs. old GENDER: Male ENDOSCOPIST:Johnetta Sloniker, MD REFERRED BY: Renaye Rakers, M.D. PROCEDURE DATE:  08/23/2011 PROCEDURE:   EGD, diagnostic ASA CLASS:    Class II INDICATIONS: drop in hgb, ?GI bleed. MEDICATION: Benadryl 25 mg, Versed 50 mcg, Versed 5 milligrams IV TOPICAL ANESTHETIC:   Cetacaine Spray  DESCRIPTION OF PROCEDURE:   After the risks and benefits of the procedure were explained, informed consent was obtained.  The endoscope was introduced through the mouth  and advanced to the second duodenum      .  The instrument was slowly withdrawn as the mucosa was fully examined.    the larynx looked normal. The esophagus was easily entered.  The esophagus was endoscopically normal, with the Z line at 40 cm. I believe this corresponded to the diaphragmatic hiatus. I do NOT think that a large hiatal hernia was present.  However, on entering the stomach, there did appear to be a "shelf" in the proximal fundic region, with mucosal hemorrhage present in several locations, but no erosions, ulcerations, or mass evident. A retroflexed view of the cardia itself was unremarkable.  The stomach was otherwise normal, and specifically without free blood or coffee-ground material.  The pylorus, duodenal bulb, and second duodenum looked normal.  Since the patient had been biopsied during his recent procedure about 2 weeks ago, I elected not to obtain repeat biopsies today.               The scope was then withdrawn from the patient and the procedure completed.  COMPLICATIONS: There were no complications.   ENDOSCOPIC IMPRESSION: 1. No definite source of drop in hemoglobin, or orthostatic symptomatology, endoscopically evident 2. Mucosal hemorrhage on mucosal "shelf" in proximal stomach. The reason for  this anatomy is not entirely unclear, but it raises the question of a possible extrinsic lesion or mass.  RECOMMENDATIONS: 1. Attempt to obtain report of previous CT scan that the patient indicates he had. If unable to do so, consider CT scan on this admission 2. Consider endoscopic ultrasound of the proximal stomach as previously suggested by Dr. Bosie Clos 3. Proceed to sigmoidoscopic evaluation to determine whether blood from a post-polypectomy bleed could be pooled  in the more proximal colon   _______________________________ eSigned:  Bernette Redbird, MD 08/23/2011 6:03 PM      PATIENT NAME:  Frederick Maddox, Frederick Maddox MR#: [id number]

## 2011-08-23 NOTE — Op Note (Signed)
[facility] [address] [place], [address]   COLONOSCOPY PROCEDURE REPORT  PATIENT: Frederick Maddox, Frederick Maddox  MR#: [id number] BIRTHDATE: June 30, 1965 , 46  yrs. old GENDER: Male ENDOSCOPIST: Bernette Redbird, MD REFERRED BY:  Renaye Rakers, M.D. PROCEDURE DATE:  08/23/2011 PROCEDURE:   Colonoscopy, incomplete ASA CLASS:   Class II INDICATIONS:drop in hgb, anemia. MEDICATIONS: Benadryl 25 mg, fentanyl 50 mcg, Versed 5 mg IV  DESCRIPTION OF PROCEDURE:   After the risks and benefits and of the procedure were explained, informed consent was obtained.        The Pentax Colonoscope [id number]  endoscope was introduced through the anus and advanced to approximately 30 cm, with further advancement inhibited by the presence of a very large amount of formed, brown stool. There was no blood in the colonic lumen whatsoever, up to the limit of the exam, nor were any mucosal abnormalities observed.y] .   The scope was then withdrawn from the patient and the procedure completed.  COMPLICATIONS: There were no complications. ENDOSCOPIC IMPRESSION: 1. No evidence of lower GI bleeding. However, cannot totally exclude blood pooled in the proximal colon, since only the distal colon was able to be examined on this unprepped exam. However, in general, blood in the colon acts as a laxative, so if the patient had lost a significant amount of blood, for example from a post-polypectomy bleed, I would expect for him to have been having diarrhea and hematochezia. RECOMMENDATIONS: Laxatives to clean out the colon. continue with gastric workup REPEAT EXAM:  cc:  _______________________________ eSignedBernette Redbird, MD 08/23/2011 6:10 PM

## 2011-08-23 NOTE — Interval H&P Note (Signed)
History and Physical Interval Note:  08/23/2011 5:04 PM  Frederick Maddox  has presented today for surgery, with the diagnosis of Anemia  The various methods of treatment have been discussed with the patient. After consideration of risks, benefits and other options for treatment, the patient has consented to  Procedure(s) (LRB): ESOPHAGOGASTRODUODENOSCOPY (EGD) (N/A) COLONOSCOPY (N/A) as a surgical intervention .  The patient's history has been reviewed, patient examined, no change in status, stable for surgery.  I have reviewed the patient's chart and labs.  Questions were answered to the patient's satisfaction.     Florencia Reasons

## 2011-08-23 NOTE — Consult Note (Signed)
Referring Provider: Dr. Owens Loffler Primary Care Physician:  Geraldo Pitter, MD Primary Gastroenterologist:  Dr. Charlott Rakes  Reason for Consultation:  Anemia (drop in hemoglobin, orthostasis,? GI bleed)  HPI: Frederick Maddox is a 46 y.o. male to the hospital yesterday because of severe orthostatic symptoms, even in bed. He was documented to have orthostatic vitals her in the emergency room.  One week ago, he underwent endoscopy and colonoscopy by Dr. Charlott Rakes because of anemia. The endoscopy showed some clots in the fundic region of the stomach, in association with a very large hiatal hernia, raising the question of possible Cameron lesions.  The colonoscopy, on the other hand, revealed a 12 mm polyp in the cecal region which was hot snared, and a 4 mm polyp in the sigmoid that was also hot snared.  The patient is not on ulcerogenic medications or blood thinners, and has not noticed any evident GI bleeding.   Past Medical History  Diagnosis Date  . Diabetes mellitus   . Depression   . Colonic polyp August 2013    Two removed, pathology pending  . Hiatal hernia   . GERD (gastroesophageal reflux disease)   . Tobacco use   . H/O hypotestosteronemia     Past Surgical History  Procedure Date  . Polypectomy     Prior to Admission medications   Medication Sig Start Date End Date Taking? Authorizing Provider  acetaminophen (TYLENOL) 500 MG tablet Take 1,000 mg by mouth every 6 (six) hours as needed. pain   Yes Historical Provider, MD  docusate sodium (COLACE) 100 MG capsule Take 100 mg by mouth daily.   Yes Historical Provider, MD  ferrous sulfate 325 (65 FE) MG tablet Take 325 mg by mouth daily with breakfast. 08/08/11 08/07/12 Yes Clanford Cyndie Mull, MD  fludrocortisone (FLORINEF) 0.1 MG tablet Take 0.1 mg by mouth every morning.   Yes Historical Provider, MD  HYDROcodone-acetaminophen (VICODIN) 5-500 MG per tablet Take 1 tablet by mouth every 6 (six) hours as needed. pain    Yes Historical Provider, MD  megestrol (MEGACE ES) 625 MG/5ML suspension Take 625 mg by mouth daily.   Yes Historical Provider, MD  omeprazole (PRILOSEC) 20 MG capsule Take 20 mg by mouth daily. 08/08/11 08/07/12 Yes Clanford Cyndie Mull, MD  Testosterone (ANDROGEL) 20.25 MG/1.25GM (1.62%) GEL Place 2 application onto the skin daily. 2 pump each day to upper arm and chest (1 to left side, 1 to right side)   Yes Historical Provider, MD  Vilazodone HCl (VIIBRYD) 40 MG TABS Take 40 mg by mouth daily.   Yes Historical Provider, MD  vitamin B-12 (CYANOCOBALAMIN) 1000 MCG tablet Take 1,000 mcg by mouth daily. 08/08/11 08/07/12 Yes Clanford Cyndie Mull, MD    Current Facility-Administered Medications  Medication Dose Route Frequency Provider Last Rate Last Dose  . 0.9 %  sodium chloride infusion   Intravenous Continuous Renae Fickle, MD 150 mL/hr at 08/23/11 1625 500 mL at 08/23/11 1625  . acetaminophen (TYLENOL) tablet 1,000 mg  1,000 mg Oral Q6H PRN Renae Fickle, MD      . alum & mag hydroxide-simeth (MAALOX/MYLANTA) 200-200-20 MG/5ML suspension 30 mL  30 mL Oral Q6H PRN Renae Fickle, MD      . ferrous sulfate tablet 325 mg  325 mg Oral Q breakfast Renae Fickle, MD   325 mg at 08/23/11 1027  . fludrocortisone (FLORINEF) tablet 0.1 mg  0.1 mg Oral q morning - 10a Renae Fickle, MD   0.1 mg at 08/23/11  1027  . megestrol (MEGACE) 400 MG/10ML suspension 625 mg  625 mg Oral Daily Renae Fickle, MD   625 mg at 08/23/11 1028  . multivitamin with minerals tablet 1 tablet  1 tablet Oral Daily Renae Fickle, MD   1 tablet at 08/23/11 1027  . nicotine polacrilex (NICORETTE) gum 2 mg  2 mg Oral PRN Renae Fickle, MD   2 mg at 08/23/11 1236  . ondansetron (ZOFRAN) tablet 4 mg  4 mg Oral Q6H PRN Renae Fickle, MD       Or  . ondansetron (ZOFRAN) injection 4 mg  4 mg Intravenous Q6H PRN Renae Fickle, MD      . pantoprazole (PROTONIX) injection 40 mg  40 mg Intravenous Q12H Renae Fickle, MD   40 mg  at 08/23/11 1033  . polyethylene glycol (MIRALAX / GLYCOLAX) packet 17 g  17 g Oral Daily PRN Renae Fickle, MD      . sodium chloride 0.9 % bolus 1,000 mL  1,000 mL Intravenous Once Lollie Sails, MD   1,000 mL at 08/22/11 1659  . sodium chloride 0.9 % injection 3 mL  3 mL Intravenous Q12H Renae Fickle, MD      . testosterone (ANDROGEL) gel 5 g  5 g Transdermal Daily Renae Fickle, MD   5 g at 08/22/11 2301  . vitamin B-12 (CYANOCOBALAMIN) tablet 1,000 mcg  1,000 mcg Oral Daily Renae Fickle, MD   1,000 mcg at 08/23/11 1027  . DISCONTD: enoxaparin (LOVENOX) injection 40 mg  40 mg Subcutaneous Q24H Renae Fickle, MD      . DISCONTD: megestrol (MEGACE ES) 625 MG/5ML suspension 625 mg  625 mg Oral Daily Renae Fickle, MD      . DISCONTD: pantoprazole (PROTONIX) EC tablet 40 mg  40 mg Oral Q1200 Renae Fickle, MD      . DISCONTD: Testosterone 20.25 MG/1.25GM (1.62%) GEL 2 application  2 application Transdermal Daily Renae Fickle, MD        Allergies as of 08/22/2011  . (No Known Allergies)    Family History  Problem Relation Age of Onset  . Diabetes type II Mother     many family members  . Hypertension Brother     many family members  . Breast cancer Maternal Grandmother   . Breast cancer Cousin   . Brain cancer Maternal Grandmother     History   Social History  . Marital Status: Single    Spouse Name: N/A    Number of Children: N/A  . Years of Education: N/A   Occupational History  . Not on file.   Social History Main Topics  . Smoking status: Former Smoker -- 0.5 packs/day    Types: Cigarettes    Quit date: 08/22/2011  . Smokeless tobacco: Not on file  . Alcohol Use: 0.5 oz/week    1 drink(s) per week  . Drug Use: No  . Sexually Active: Not on file   Other Topics Concern  . Not on file   Social History Narrative  . No narrative on file    Physical Exam: Vital signs in last 24 hours: Temp:  [98.4 F (36.9 C)-99.2 F (37.3 C)] 99.1 F  (37.3 C) (08/19 1610) Pulse Rate:  [73-97] 80  (08/19 1610) Resp:  [12-21] 18  (08/19 1610) BP: (101-148)/(69-88) 148/88 mmHg (08/19 1610) SpO2:  [100 %] 100 % (08/19 1610) Weight:  [73.9 kg (162 lb 14.7 oz)] 73.9 kg (162 lb 14.7 oz) (08/18 2125) Last BM Date: 08/22/11  General:   Alert,  Well-developed, well-nourished, pleasant and cooperative African American male in NAD Head:  Normocephalic and atraumatic. Eyes:  Sclera clear, no icterus.   Mouth:   No ulcerations or lesions.  Oropharynx pink & moist. Lungs:  Clear throughout to auscultation.   No wheezes, crackles, or rhonchi. No evident respiratory distress. Heart:   Regular rate and rhythm; no murmurs, clicks, rubs,  or gallops. Abdomen:  Soft, nontender, nontympanitic, and nondistended. No masses, hepatosplenomegaly or ventral hernias noted. Normal bowel sounds.  Msk:   Symmetrical without gross deformities. Extremities:   Without clubbing, cyanosis, or edema. Neurologic:  Alert and coherent;  grossly normal neurologically. Skin:  Intact without significant lesions or rashes. Psych:   Alert and cooperative. Normal mood and affect.  Intake/Output from previous day: 08/18 0701 - 08/19 0700 In: 1137.5 [I.V.:1137.5] Out: 1700 [Urine:1700] Intake/Output this shift: Total I/O In: 842.5 [P.O.:480; Blood:362.5] Out: 2125 [Urine:2125]  Lab Results:  Basename 08/23/11 1012 08/23/11 0730 08/22/11 1322 08/22/11 1310  WBC 9.2 8.8 -- 10.3  HGB 7.9* 7.9* 10.9* --  HCT 24.4* 24.7* 32.0* --  PLT 362 353 -- 382   BMET  Basename 08/23/11 0730 08/22/11 1322  NA 141 143  K 3.6 3.8  CL 107 105  CO2 25 --  GLUCOSE 97 95  BUN 6 7  CREATININE 0.75 0.90  CALCIUM 9.6 --   LFT No results found for this basename: PROT,ALBUMIN,AST,ALT,ALKPHOS,BILITOT,BILIDIR,IBILI in the last 72 hours PT/INR No results found for this basename: LABPROT:2,INR:2 in the last 72 hours  Studies/Results: No results found.  Impression: The drop in  hemoglobin from baseline (his hemoglobin was 11.1 on June 18 at his primary physician's office) coupled with orthostatic symptoms brings to mind a GI bleed, even in the absence of passage of visible blood. It may be, with his normal BUN, that this has been a gradual, progressive ooze from the proximal colon with delayed transit.  Plan: Proceed to endoscopic evaluation for an updated evaluation of his proximal gastric lesions, and to an unprepped colonoscopy/flexible sigmoidoscopy to see if there is blood in the lower GI tract. Procedure is discussed, patient agreeable.   LOS: 1 day   Elycia Woodside V  08/23/2011, 5:10 PM

## 2011-08-23 NOTE — Progress Notes (Signed)
Utilization review completed.  

## 2011-08-23 NOTE — Progress Notes (Signed)
Endoscopy well tolerated. Appears to show mucosal hemorrhage with anatomic deformity in the proximal stomach, although I do not feel that the patient clearly has a large hiatal hernia as previously felt. Review of the patient's CT scan from a month ago, with the radiologist over the telephone, indicates that no hiatal hernia is seen, nor is there any evidence of an extrinsic mass effect or adenopathy. Thus, the reason for the mucosal hemorrhages which have been noted in that region on his both his current and his previous endoscopy remains unclear. Accordingly, I will discuss with my partner whether endoscopic ultrasound might offer anything in this circumstance.  Colonoscopy was limited by the lack of prep, but showed absence of any blood in the distal colonic lumen, and a large amount of formed brown stool, so I seriously doubt that this patient has had a post polypectomy bleed, but to help confirm the absence of blood in the colonic lumen, we will administer laxatives tonight and tomorrow night.   Florencia Reasons, M.D. [phone]

## 2011-08-24 DIAGNOSIS — E2740 Unspecified adrenocortical insufficiency: Secondary | ICD-10-CM

## 2011-08-24 DIAGNOSIS — E2749 Other adrenocortical insufficiency: Secondary | ICD-10-CM

## 2011-08-24 DIAGNOSIS — Z720 Tobacco use: Secondary | ICD-10-CM

## 2011-08-24 LAB — IGA: IgA: 156 mg/dL (ref 68–379)

## 2011-08-24 LAB — BASIC METABOLIC PANEL
CO2: 28 mEq/L (ref 19–32)
Calcium: 10 mg/dL (ref 8.4–10.5)
Chloride: 106 mEq/L (ref 96–112)
GFR calc Af Amer: >90 mL/min (ref >90)
Sodium: 143 mEq/L (ref 135–145)

## 2011-08-24 LAB — CBC
MCH: 25.4 pg — ABNORMAL LOW (ref 26.0–34.0)
Platelets: 351 10*3/uL (ref 150–400)
RBC: 4.02 MIL/uL — ABNORMAL LOW (ref 4.22–5.81)
RDW: 18.9 % — ABNORMAL HIGH (ref 11.5–15.5)
WBC: 10.4 10*3/uL (ref 4.0–10.5)

## 2011-08-24 LAB — TISSUE TRANSGLUTAMINASE, IGA: Tissue Transglutaminase Ab, IgA: 4.5 U/mL (ref <20)

## 2011-08-24 LAB — TYPE AND SCREEN
ABO/RH(D): O POS
Antibody Screen: NEGATIVE

## 2011-08-24 MED ORDER — HYDROCORTISONE 5 MG PO TABS: 5.0000 mg | ORAL_TABLET | ORAL | Status: AC

## 2011-08-24 MED ORDER — HYDROCORTISONE 10 MG PO TABS
10.0000 mg | ORAL_TABLET | ORAL | Status: DC
  Filled 2011-08-24: qty 1

## 2011-08-24 MED ORDER — MAGNESIUM HYDROXIDE 400 MG/5ML PO SUSP: 30.0000 mL | Freq: Every day | ORAL | Status: AC

## 2011-08-24 MED ORDER — PANTOPRAZOLE SODIUM 40 MG PO TBEC: 40.0000 mg | DELAYED_RELEASE_TABLET | Freq: Every day | ORAL | Status: DC

## 2011-08-24 MED ORDER — HYDROCORTISONE 5 MG PO TABS
5.0000 mg | ORAL_TABLET | ORAL | Status: DC
  Administered 2011-08-24: 5 mg via ORAL
  Filled 2011-08-24: qty 1

## 2011-08-24 MED ORDER — HYDROCORTISONE 10 MG PO TABS: 10.0000 mg | ORAL_TABLET | ORAL | Status: AC

## 2011-08-24 MED ORDER — NICOTINE POLACRILEX 2 MG MT GUM: 2.0000 mg | CHEWING_GUM | OROMUCOSAL | Status: AC | PRN

## 2011-08-24 NOTE — Progress Notes (Signed)
Yesterday's endoscopic findings were discussed in detail with the patient.  Case discussed with his attending physician, who indicates that he had a low cortisol level, raising the question of adrenal insufficiency.  His hemoglobin has risen appropriately to 10.2 following transfusion of one unit of packed cells. Since the current level is about a gram lower than it was when checked in his primary physician's office on June 18, and it took one unit of packed cells to get up to this level, it appears that the patient has had loss of about 2 units of blood altogether, although the timing of that blood loss is unclear since the patient has not had a clinically apparent GI bleed. The patient indicates that he feels dizzy when he gets up, even today after his transfusion, so it is not clear whether his orthostasis or his dizziness are related to his recent drop in hemoglobin.  The relationship of the patient's proximal gastric mucosal hemorrhagic abnormality, to his chronic anemia, and also to his recent 2 g drop in hemoglobin, is not clear. The exact character of that abnormality is also not clear.  Furthermore, the apparent anatomic abnormality of the proximal stomach does not correlate with any obvious abnormality on the patient's CT scan.  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 pylori infection. Small bowel biopsies were negative.  Recommendations:  1. Okay for the patient to eat 2. Continue gentle laxation to help push out any old blood at may be in the proximal colon (Doubt). 3. Although I feel that his GI issues need further evaluation and clarification, specifically, further anatomic definition of the proximal stomach, and further characterization of his  proximal gastric mucosal hemorrhagic abnormality, this can be accomplished as an outpatient. He will probably need an endoscopic ultrasound. Thus, discharge from the GI tract standpoint would be acceptable at any time, although I recognize that there were other issues, such as possible adrenal insufficiency, that need to be addressed first.  Florencia Reasons, M.D. [phone]

## 2011-08-24 NOTE — Evaluation (Signed)
Physical Therapy Evaluation Patient Details Name: Frederick Maddox MRN: [id number] DOB: [birth year] Today's Date: 08/24/2011 Time: 4098-1191 PT Time Calculation (min): 13 min  PT Assessment / Plan / Recommendation Clinical Impression  pt presents with Ortho Hypotension and Anemia.  pt's BP supine 128/82, sitting 114/80, standing 92/61, and 95/63 after amb.  pt moves Independently and has no further PT needs.      PT Assessment  Patent does not need any further PT services    Follow Up Recommendations  No PT follow up    Barriers to Discharge        Equipment Recommendations  None recommended by PT    Recommendations for Other Services     Frequency      Precautions / Restrictions Precautions Precautions: Fall Restrictions Weight Bearing Restrictions: No   Pertinent Vitals/Pain Denies pain.        Mobility  Bed Mobility Bed Mobility: Supine to Sit;Sitting - Scoot to Edge of Bed;Sit to Supine Supine to Sit: 7: Independent Sitting - Scoot to Edge of Bed: 7: Independent Sit to Supine: 7: Independent Transfers Transfers: Sit to Stand;Stand to Sit Sit to Stand: 7: Independent;With upper extremity assist;From bed Stand to Sit: 7: Independent;With upper extremity assist;To bed Ambulation/Gait Ambulation/Gait Assistance: 6: Modified independent (Device/Increase time) Ambulation Distance (Feet): 200 Feet Assistive device: None Ambulation/Gait Assistance Details: pt antalgic secondary to R knee hematoma.   Gait Pattern: Antalgic Stairs: No Wheelchair Mobility Wheelchair Mobility: No    Exercises     PT Diagnosis:    PT Problem List:   PT Treatment Interventions:     PT Goals    Visit Information  Last PT Received On: 08/24/11 Assistance Needed: +1    Subjective Data  Subjective: I know if I get dizzy I need to stop and wait for it to go away.   Patient Stated Goal: Home   Prior Functioning  Home Living Lives With: Family (brother and grandmother) Available  Help at Discharge: Family;Available 24 hours/day Type of Home: House Home Access: Stairs to enter [employer] of Steps: 4 Entrance Stairs-Rails: Can reach both Home Layout: One level Bathroom Shower/Tub: Engineer, manufacturing systems: Standard Home Adaptive Equipment: Straight cane Prior Function Level of Independence: Independent Able to Take Stairs?: Yes Driving: Yes Vocation: Part time employment Communication Communication: No difficulties Dominant Hand: Right    Cognition  Overall Cognitive Status: Appears within functional limits for tasks assessed/performed Arousal/Alertness: Awake/alert Orientation Level: Appears intact for tasks assessed Behavior During Session: [facility] for tasks performed    Extremity/Trunk Assessment Right Lower Extremity Assessment RLE ROM/Strength/Tone: Deficits RLE ROM/Strength/Tone Deficits: pt still recovering from Hematoma in R knee causing decreased ROM.   RLE Sensation: WFL - Light Touch Left Lower Extremity Assessment LLE ROM/Strength/Tone: Within functional levels LLE Sensation: WFL - Light Touch Trunk Assessment Trunk Assessment: Normal   Balance Balance Balance Assessed: No  End of Session PT - End of Session Equipment Utilized During Treatment: Gait belt Activity Tolerance: Patient tolerated treatment well Patient left: in bed;with call bell/phone within reach Nurse Communication: Mobility status  GP     Sunny Schlein, [place] [phone] 08/24/2011, 2:55 PM

## 2011-08-24 NOTE — Progress Notes (Signed)
Pt discharged to home per MD order. Pt received and reviewed with RN all discharge instructions and medication information including follow-up appointments and prescriptions.  Pt alert and oriented at discharge with no complaints of pain. Pt escorted to private vehicle via wheelchair by nurse tech.  Efraim Kaufmann

## 2011-08-24 NOTE — Care Management Note (Signed)
    Page 1 of 1   08/24/2011     4:08:49 PM   CARE MANAGEMENT NOTE 08/24/2011  Patient:  Frederick Maddox, Frederick Maddox   Account Number:  [id number]  Date Initiated:  08/23/2011  Documentation initiated by:  Donn Pierini  Subjective/Objective Assessment:   Pt admitted with orthostatic bp, and hgb 7.9     Action/Plan:   PTA pt lived at home alone,   Anticipated DC Date:  08/25/2011   Anticipated DC Plan:  HOME/SELF CARE      DC Planning Services  CM consult  PCP issues      Choice offered to / List presented to:             Status of service:  Completed, signed off Medicare Important Message given?   (If response is "NO", the following Medicare IM given date fields will be blank) Date Medicare IM given:   Date Additional Medicare IM given:    Discharge Disposition:  HOME/SELF CARE  Per UR Regulation:  Reviewed for med. necessity/level of care/duration of stay  If discussed at Long Length of Stay Meetings, dates discussed:    Comments:  PCP- [facility]  08/24/11- 1600- Donn Pierini RN, BSN [phone] Pt interested in finding new PCP- spoke with pt at bedside- info including number given to pt on [facility] and how to use- also gave pt info on contacting his insurance provider to find out PCPs that are in network. Pt to f/u on finding a new PCP.

## 2011-08-24 NOTE — Progress Notes (Addendum)
[facility] PROGRESS NOTE  MATTSON DAYAL ZOX:[id number] DOB: 1965-06-14 DOA: 08/22/2011 PCP: Geraldo Pitter, MD  Assessment/Plan: Principal Problem:  *Orthostatic hypotension Active Problems:  Weight loss  Diabetes mellitus  Depression  Iron deficiency anemia  B12 deficiency  Frequent loose stools  GI bleed  Anemia, acute/GIB.  Known GIB from earlier this month and recently EGD/colo by Dr. Bosie Clos.  Hgb dropped 3 points 8/19, transfused and EGD and flex sig performed same day without finding area meriting intervention or clear source of bleeding.  (with possibly some decrease from hemodilution).  Hgb stable -  Appreciate GI recommendations -  Continue B12 and iron  -  Regular diet starting today -  Bowel regimen , day 2/2 -  Protonix 40mg  IV q12h  Adrenal insufficiency.  In setting of low testosterone and wasting, consider polyglandular syndrome versus panhypopit. -  ACTH stim test this morning, results pending -  Spoke with Dr. Lucianne Muss [facility] regarding further inpatient work up and suggested adding ACTH level to this morning's labs and following up as an outpatient. -  Start hydrocortisone treatment 10mg  qAM and 5mg  at 1pm. -  Continue florinef.   Weight loss: Likely due to depression, but differential includes malabsorptive disorders such as celiac, malignancy, and certain infections, such as HIV and TB  - Anti-tTG IgA pending -  IgA wnl - HIV negative - Quantiferon gold pending - Continue Megace   Loose stools:  DDx includes IBS, IBD, celiac, and less likely infection. Per patient, no evidence of celiac or crohn's on recent scopes.  - Celiac titers pending - Hold colace   Diabetes:  Fingersticks well controlled without insulin  B12 deficiency: Likely due to poor diet vs. Malabsorption  - Consider anti-IF and anti-parietal cell as outpatient   GERD: Stable. Continue Omeprazole   Depression, stable, hold home medication (not on formulary)   DIET:   Regular  diet ACCESS: PIV  PROPH:   SCDs, OOB  Code Status: Full  Family Communication: Plan discussed with patient, aunt, and brother  Disposition Plan: Pending stable hemoglobin, patient started on tx for adrenal insufficiency with more stable blood pressures.   Brief narrative: The patient is a 46 year old male with history of DM dx 2 years ago, depression, tobacco use, and GERD who presents with lightheadedness while sitting up or standing. He has been ill since two years ago when he was diagnosed with DM after which time he lost almost 100lbs which he attributes to uncontrolled depression and not eating. About two months ago, he was seen by his primary care doctor who was concerned about abdominal cramps in the setting of weight loss and referred him for colonoscopy. Six weeks ago, he started having episodes of lightheadedness/presyncope requiring him to lie down. He was admitted to [facility] 8/2 after he progressed to a syncopal episode. He was found to be anemia with iron and B12 deficiency and started on iron and B12 PO. He underwent EGD and colonoscopy which revealed blood clot in the stomach and two colonic polyps which were resected. Since then, he has had progressive lightheadedness and dizziness with sitting and standing. His aunt has been taking his blood pressures, which were reviewed and reveal >57mmHg drop in BP and > 20bpm increase in HR. His PCP prescribed megace and florinef, which he started taking two days ago. This morning, he felt lightheaded even lying in bed. He came to the ER because he was unable to complete ADLS.  In the ER, he was orthostatic and  given 2L of IVF. His hemoglobin has recovered somewhat from his previous admission.    Consultants:  GI  Procedures:  none  Antibiotics:  none  HPI/Subjective:  Patient states that he continues to feel lightheaded.  Denies indigestion, heartburn, chest pain, hemoptysis, hematemesis, hematochezia.  No other abnormal brusiing  or bleeding.  Denies nausea, vomiting, fevers.    Objective: Filed Vitals:   08/23/11 1800 08/23/11 1810 08/23/11 2100 08/24/11 0500  BP: 124/84 124/90 133/83 127/77  Pulse:   91 85  Temp:   99 F (37.2 C) 98.9 F (37.2 C)  TempSrc:      Resp: 21 15 18 18   Height:      Weight:      SpO2: 100% 100% 100% 100%    Intake/Output Summary (Last 24 hours) at 08/24/11 0840 Last data filed at 08/24/11 5284  Gross per 24 hour  Intake 1442.5 ml  Output   4175 ml  Net -2732.5 ml   Filed Weights   08/22/11 2125  Weight: 73.9 kg (162 lb 14.7 oz)    Exam:   General:  AAM, sitting up in bed, no acute distress  HEENT:  MMM  Cardiovascular: RRR, no murmurs, rubs or gallops  Respiratory: CTAB  Abdomen:  NABS, soft, nontender, nondistended, no organomegaly  Skin:  No petechiae  Data Reviewed: Basic Metabolic Panel:  Lab 08/24/11 1324 08/23/11 0730 08/22/11 1322  NA 143 141 143  K 3.6 3.6 3.8  CL 106 107 105  CO2 28 25 --  GLUCOSE 80 97 95  BUN 7 6 7   CREATININE 0.79 0.75 0.90  CALCIUM 10.0 9.6 --  MG -- -- --  PHOS -- -- --   Liver Function Tests: No results found for this basename: AST:5,ALT:5,ALKPHOS:5,BILITOT:5,PROT:5,ALBUMIN:5 in the last 168 hours No results found for this basename: LIPASE:5,AMYLASE:5 in the last 168 hours No results found for this basename: AMMONIA:5 in the last 168 hours CBC:  Lab 08/24/11 0620 08/23/11 1935 08/23/11 1012 08/23/11 0730 08/22/11 1322 08/22/11 1310  WBC 10.4 -- 9.2 8.8 -- 10.3  NEUTROABS -- -- -- -- -- 6.4  HGB 10.2* 9.5* 7.9* 7.9* 10.9* --  HCT 30.3* 29.1* 24.4* 24.7* 32.0* --  MCV 75.4* -- 73.7* 73.7* -- 74.1*  PLT 351 -- 362 353 -- 382   Cardiac Enzymes: No results found for this basename: CKTOTAL:5,CKMB:5,CKMBINDEX:5,TROPONINI:5 in the last 168 hours BNP (last 3 results) No results found for this basename: PROBNP:3 in the last 8760 hours CBG:  Lab 08/22/11 1444  GLUCAP 99    No results found for this or any  previous visit (from the past 240 hour(s)).   Studies: Dg Chest 2 View  08/06/2011  *RADIOLOGY REPORT*  Clinical Data: Hypotension.  Weight loss  CHEST - 2 VIEW  Comparison:  None.  Findings:  The heart size and mediastinal contours are within normal limits.  Both lungs are clear.  The visualized skeletal structures are unremarkable.  IMPRESSION: No active cardiopulmonary disease.  Original Report Authenticated By: Camelia Phenes, M.D.   Dg Knee 1-2 Views Right  08/06/2011  *RADIOLOGY REPORT*  Clinical Data: Fall with right knee pain.  RIGHT KNEE - 1-2 VIEW  Comparison: 06/24/2011  Findings: No evidence of acute fracture, subluxation or dislocation identified.  A very small knee effusion is present.  No radio-opaque foreign bodies are present.  No focal bony lesions are noted.  The joint spaces are unremarkable.  IMPRESSION: Very small knee effusion without acute bony abnormality.  Original Report Authenticated By: Rosendo Gros, M.D.   Ct Abdomen Pelvis W Contrast  08/03/2011  **ADDENDUM** CREATED: 08/03/2011 15:46:53  This is a correction to the previously dictated report.  In the original dictation the clinical data was mistakenly reported as "melanoma".  This was a voice recognition error that was not identified at the time of the original dictation.  This should have read, "melena."  **END ADDENDUM** SIGNED BY: Florencia Reasons, M.D.   07/29/2011  *RADIOLOGY REPORT*  Clinical Data: 60 pounds of weight loss since October 2012. Diarrhea and constipation.  Melanoma.  CT ABDOMEN AND PELVIS WITH CONTRAST  Technique:  Multidetector CT imaging of the abdomen and pelvis was performed following the standard protocol during bolus administration of intravenous contrast.  Contrast:  100 ml of Omnipaque-300.  Comparison: No priors.  Findings:  Lung Bases: Unremarkable.  Abdomen/Pelvis:  The enhanced appearance of the liver, gallbladder, pancreas, spleen, bilateral adrenal glands and bilateral kidneys is  unremarkable. No ascites or pneumoperitoneum and no definite pathologic distension of bowel.  Multiple borderline loops of mid small bowel are noted.  There is gas and stool, as well as oral contrast material, throughout the colon.  No definite pathologic lymphadenopathy identified in the abdomen or pelvis.  Prostate and urinary bladder are unremarkable in appearance.  Musculoskeletal: There are no aggressive appearing lytic or blastic lesions noted in the visualized portions of the skeleton.  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  IMPRESSION: 1.  No suspicious findings in the abdomen or pelvis to account the patient's history of weight loss. 2.  Multiple borderline dilated loops of bowel are nonspecific.  No evidence of frank bowel obstruction at this time. 3.  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  Original Report Authenticated By: Florencia Reasons, M.D.    Scheduled Meds:    . cosyntropin  0.25 mg Intravenous Once  . ferrous sulfate  325 mg Oral Q breakfast  . fludrocortisone  0.1 mg Oral q morning - 10a  . magnesium hydroxide  30 mL Oral QHS  . megestrol  625 mg Oral Daily  . multivitamin with minerals  1 tablet Oral Daily  . pantoprazole (PROTONIX) IV  40 mg Intravenous Q12H  . sodium chloride  3 mL Intravenous Q12H  . testosterone  5 g Transdermal Daily  . vitamin B-12  1,000 mcg Oral Daily  . DISCONTD: enoxaparin (LOVENOX) injection  40 mg Subcutaneous Q24H  . DISCONTD: pantoprazole  40 mg Oral Q1200   Continuous Infusions:    . sodium chloride 500 mL (08/23/11 1625)    Principal Problem:  *Orthostatic hypotension Active Problems:  Weight loss  Diabetes mellitus  Depression  Iron deficiency anemia  B12 deficiency  Frequent loose stools  GI bleed    Time spent:  30    Diyari Cherne, [facility]  [facility] Pager [phone]. If 8PM-8AM, please contact night-coverage at [email or web address], password [facility] 08/24/2011, 8:40 AM  LOS: 2 days

## 2011-08-26 ENCOUNTER — Encounter (HOSPITAL_COMMUNITY): Payer: Self-pay | Admitting: Gastroenterology

## 2011-08-26 LAB — ACTH STIMULATION, 3 TIME POINTS
Cortisol, 30 Min: 13.8 ug/dL — ABNORMAL LOW (ref >20)
Cortisol, 60 Min: 17 ug/dL — ABNORMAL LOW (ref >20)
Cortisol, Base: 5.4 ug/dL

## 2011-08-31 NOTE — Discharge Summary (Signed)
Physician Discharge Summary  Frederick Maddox YQM:[id number] DOB: 1965/05/04 DOA: 08/22/2011  PCP: Geraldo Pitter, MD  Admit date: 08/22/2011 Discharge date: 08/24/2011  Recommendations for Outpatient Follow-up:  Patient to follow up with endocrinology regarding new diagnosis of adrenal insufficiency.  Labs pending at discharge:  Results of ACTH stimulation test and results of AM ACTH level.  Discharge Diagnoses:  Principal Problem:  *Adrenal insufficiency Active Problems:  Weight loss  Diabetes mellitus  Depression  Iron deficiency anemia  B12 deficiency  Orthostatic hypotension  Frequent loose stools  GI bleed  Tobacco abuse   Discharge Condition: stable, improved  Diet recommendation: carbohydrate controlled.  Liberalize salt to promote normal BP.  Wt Readings from Last 3 Encounters:  08/22/11 73.9 kg (162 lb 14.7 oz)  08/22/11 73.9 kg (162 lb 14.7 oz)  08/08/11 73.6 kg (162 lb 4.1 oz)    History of present illness:   The patient is a 46 year old male with history of DM dx 2 years ago, depression, tobacco use, and GERD who presents with lightheadedness while sitting up or standing. He has been ill since two years ago when he was diagnosed with DM after which time he lost almost 100lbs which he attributes to uncontrolled depression and not eating. About two months ago, he was seen by his primary care doctor who was concerned about abdominal cramps in the setting of weight loss and referred him for colonoscopy. Six weeks ago, he started having episodes of lightheadedness/presyncope requiring him to lie down. He was admitted to [facility] 8/2 after he progressed to a syncopal episode. He was found to be anemia with iron and B12 deficiency and started on iron and B12 PO. He underwent EGD and colonoscopy which revealed blood clot in the stomach and two colonic polyps which were resected. Since then, he has had progressive lightheadedness and dizziness with sitting and standing. His aunt  has been taking his blood pressures, which were reviewed and reveal >75mmHg drop in BP and > 20bpm increase in HR. His PCP prescribed megace and florinef, which he started taking two days ago. This morning, he felt lightheaded even lying in bed. He came to the ER because he was unable to complete ADLS.   In the ER, he was orthostatic and given 2L of IVF. His hemoglobin has recovered somewhat from his previous admission.     Hospital Course:   GI bleed:  Known GIB from earlier in August with EGD/colo by Dr. Bosie Clos. Hgb dropped 3 points 8/19.   Blood pressure remained stable while reclining in bed.   Rectal exam was heme negative with brown stool.  He was transfused 1 unit PRBC and placed on IV PPI.  He underwent EGD and flex sig performed same day without finding area meriting intervention or clear source of bleeding however, he has a gastric "shelf" that looks similar to a hiatal hernia and that merits follow up as outpatient with endoscopic ultrasound.  His hemoglobin remained stable post-transfusion.  He was given a bowel regimen to try to move any blood in the colon out, per GI.    Adrenal insufficiency:  Patient had low morning cortisol level.  Given low testosterone, consider panhypopit a possibility.  Spoke with Dr. Lucianne Muss Endocrine.  Patient to start hydrocortisone treatment 10mg  qAM and 5mg  at 1pm and continue florinef until follow up with endocrinology.  ACTH stim test and morning ACTH level were pending at the time of discharge, however, have returned at the time of this dictation and are  low.  Patient will likely need imaging of pituitary and further laboratory evaluation for pituitary mass and other endocrinopathies.  Patient able to ambulate without lightheadedness after initiation of hydrocortisone and felt safe to return home.  Weight loss:  Celiac panel, HIV test, and Quantiferon gold negative.  May be related to adrenal insufficiency, thyroid abnormality, or growth hormone deficiency in  addition to depression.     Procedures:  EGD and colonoscopy 8/19.  Consultations:  GI  Discharge Exam: Filed Vitals:   08/24/11 1341  BP: 131/84  Pulse: 92  Temp: 99.2 F (37.3 C)  Resp: 20   Filed Vitals:   08/23/11 1810 08/23/11 2100 08/24/11 0500 08/24/11 1341  BP: 124/90 133/83 127/77 131/84  Pulse:  91 85 92  Temp:  99 F (37.2 C) 98.9 F (37.2 C) 99.2 F (37.3 C)  TempSrc:    Oral  Resp: 15 18 18 20   Height:      Weight:      SpO2: 100% 100% 100% 100%    General: Thin AAM, no acute distress HEENT:  No thrush, MMM Cardiovascular: RRR, no murmurs, rubs, or gallops Respiratory: CTAB Abd:  Soft, nondistended, nontender, no organomegaly or palpable mass Skin:  No rash   Discharge Instructions  Discharge Orders    Future Orders Please Complete By Expires   Increase activity slowly      Discharge instructions      Comments:   You were hospitalized for lightheadedness and low blood pressures and were found to have a GI bleed and possible adrenal insufficiency.  You had an upper and lower endoscopy which showed no hiatal hernia, but some inflamed tissue in your stomach which may have bled.  You did not have blood in your colon.  You were transfused one unit of blood.  Because you have a "bulge" in the stomach wall that looks like a hiatal hernia, you should follow up with the GI physicians to arrange for an endoscopic ultrasound or other imaging.    To discover adrenal insufficiency, we sent a morning cortisol test which was abnormally low.  We tried to verify this finding with an ACTH stimulation test, the results of which are pending at the time of discharge.  You were started on hydrocortisone 10mg  in the morning and 5mg  around lunch time to supplement the missing hormone.  You should follow up with a new primary care doctor (one is suggested on these papers) and an endocrinologist.    Because of your diarrhea and weight loss, you were tested for celiac disease  and were negative.  Your HIV, tuberculosis and syphilis tests were all negative (looking for causes of adrenal insufficiency).   Call MD for:  temperature >100.4      Call MD for:  persistant nausea and vomiting      Call MD for:  severe uncontrolled pain      Call MD for:  difficulty breathing, headache or visual disturbances      Call MD for:  hives      Call MD for:  persistant dizziness or light-headedness      Call MD for:  extreme fatigue      Call MD for:      Comments:   Vomiting blood or blood in your stools.     Medication List  As of 08/31/2011 11:44 AM   STOP taking these medications         docusate sodium 100 MG capsule  megestrol 625 MG/5ML suspension         TAKE these medications         acetaminophen 500 MG tablet   Commonly known as: TYLENOL   Take 1,000 mg by mouth every 6 (six) hours as needed. pain      ANDROGEL 20.25 MG/1.25GM (1.62%) Gel   Generic drug: Testosterone   Place 2 application onto the skin daily. 2 pump each day to upper arm and chest (1 to left side, 1 to right side)      ferrous sulfate 325 (65 FE) MG tablet   Take 325 mg by mouth daily with breakfast.      fludrocortisone 0.1 MG tablet   Commonly known as: FLORINEF   Take 0.1 mg by mouth every morning.      HYDROcodone-acetaminophen 5-500 MG per tablet   Commonly known as: VICODIN   Take 1 tablet by mouth every 6 (six) hours as needed. pain      hydrocortisone 10 MG tablet   Commonly known as: CORTEF   Take 1 tablet (10 mg total) by mouth daily.      hydrocortisone 5 MG tablet   Commonly known as: CORTEF   Take 1 tablet (5 mg total) by mouth daily.      magnesium hydroxide 400 MG/5ML suspension   Commonly known as: MILK OF MAGNESIA   Take 30 mLs by mouth at bedtime.      nicotine polacrilex 2 MG gum   Commonly known as: NICORETTE   Take 1 each (2 mg total) by mouth as needed for smoking cessation.      omeprazole 20 MG capsule   Commonly known as: PRILOSEC   Take  20 mg by mouth daily.      VIIBRYD 40 MG Tabs   Generic drug: Vilazodone HCl   Take 40 mg by mouth daily.      vitamin B-12 1000 MCG tablet   Commonly known as: CYANOCOBALAMIN   Take 1,000 mcg by mouth daily.           Follow-up Information    Follow up with [facility], MD. Schedule an appointment as soon as possible for a visit in 1 week.   Contact information:   [address] [facility] [place] [address] [phone]       Follow up with Neldon Labella, MD. Schedule an appointment as soon as possible for a visit in 1 week.   Contact information:   [address] [place] [address] [phone]       Follow up with Florencia Reasons, MD. Schedule an appointment as soon as possible for a visit in 1 week.   Contact information:   [address], [address] [employer], [place]. [place] [address] [phone]           The results of significant diagnostics from this hospitalization (including imaging, microbiology, ancillary and laboratory) are listed below for reference.    Significant Diagnostic Studies: Dg Chest 2 View  08/06/2011  *RADIOLOGY REPORT*  Clinical Data: Hypotension.  Weight loss  CHEST - 2 VIEW  Comparison:  None.  Findings:  The heart size and mediastinal contours are within normal limits.  Both lungs are clear.  The visualized skeletal structures are unremarkable.  IMPRESSION: No active cardiopulmonary disease.  Original Report Authenticated By: Camelia Phenes, M.D.   Dg Knee 1-2 Views Right  08/06/2011  *RADIOLOGY REPORT*  Clinical Data: Fall  with right knee pain.  RIGHT KNEE - 1-2 VIEW  Comparison: 06/24/2011  Findings: No evidence of acute fracture, subluxation or dislocation identified.  A very small knee effusion is present.  No radio-opaque foreign bodies are present.  No focal bony lesions are noted.  The joint spaces are unremarkable.  IMPRESSION: Very small  knee effusion without acute bony abnormality.  Original Report Authenticated By: Rosendo Gros, M.D.    Microbiology: No results found for this or any previous visit (from the past 240 hour(s)).   Labs: Basic Metabolic Panel: No results found for this basename: NA:5,K:5,CL:5,CO2:5,GLUCOSE:5,BUN:5,CREATININE:5,CALCIUM:5,MG:5,PHOS:5 in the last 168 hours Liver Function Tests: No results found for this basename: AST:5,ALT:5,ALKPHOS:5,BILITOT:5,PROT:5,ALBUMIN:5 in the last 168 hours No results found for this basename: LIPASE:5,AMYLASE:5 in the last 168 hours No results found for this basename: AMMONIA:5 in the last 168 hours CBC: No results found for this basename: WBC:5,NEUTROABS:5,HGB:5,HCT:5,MCV:5,PLT:5 in the last 168 hours Cardiac Enzymes: No results found for this basename: CKTOTAL:5,CKMB:5,CKMBINDEX:5,TROPONINI:5 in the last 168 hours BNP: BNP (last 3 results) No results found for this basename: PROBNP:3 in the last 8760 hours CBG: No results found for this basename: GLUCAP:5 in the last 168 hours  Time coordinating discharge: 45 minutes  Signed:  Virgil Slinger  [facility] 08/31/2011, 11:44 AM

## 2011-09-08 ENCOUNTER — Ambulatory Visit (HOSPITAL_COMMUNITY): Payer: Managed Care, Other (non HMO) | Admitting: Anesthesiology

## 2011-09-08 ENCOUNTER — Encounter (HOSPITAL_COMMUNITY): Payer: Self-pay | Admitting: *Deleted

## 2011-09-08 ENCOUNTER — Encounter (HOSPITAL_COMMUNITY): Payer: Self-pay | Admitting: Anesthesiology

## 2011-09-08 ENCOUNTER — Encounter (HOSPITAL_COMMUNITY)
Admission: RE | Disposition: A | Discharge status: Home / Self Care | Payer: Self-pay | Source: Ambulatory Visit | Attending: Gastroenterology

## 2011-09-08 ENCOUNTER — Ambulatory Visit (HOSPITAL_COMMUNITY)
Admission: RE | Admit: 2011-09-08 | Discharge: 2011-09-08 | Disposition: A | Discharge status: Home / Self Care | Payer: Managed Care, Other (non HMO) | Source: Ambulatory Visit | Attending: Gastroenterology | Admitting: Gastroenterology

## 2011-09-08 DIAGNOSIS — R933 Abnormal findings on diagnostic imaging of other parts of digestive tract: Secondary | ICD-10-CM | POA: Insufficient documentation

## 2011-09-08 DIAGNOSIS — E119 Type 2 diabetes mellitus without complications: Secondary | ICD-10-CM | POA: Insufficient documentation

## 2011-09-08 DIAGNOSIS — K219 Gastro-esophageal reflux disease without esophagitis: Secondary | ICD-10-CM | POA: Insufficient documentation

## 2011-09-08 DIAGNOSIS — F17200 Nicotine dependence, unspecified, uncomplicated: Secondary | ICD-10-CM | POA: Insufficient documentation

## 2011-09-08 HISTORY — DX: Unspecified adrenocortical insufficiency: E27.40

## 2011-09-08 HISTORY — PX: EUS: SHX5427

## 2011-09-08 LAB — GLUCOSE, CAPILLARY: Glucose-Capillary: 85 mg/dL (ref 70–99)

## 2011-09-08 SURGERY — ULTRASOUND, UPPER GI TRACT, ENDOSCOPIC
Anesthesia: Monitor Anesthesia Care

## 2011-09-08 MED ORDER — LACTATED RINGERS IV SOLN
INTRAVENOUS | Status: DC | PRN
  Administered 2011-09-08: 09:00:00 via INTRAVENOUS

## 2011-09-08 MED ORDER — SODIUM CHLORIDE 0.9 % IV SOLN: INTRAVENOUS | Status: DC

## 2011-09-08 MED ORDER — LACTATED RINGERS IV SOLN
INTRAVENOUS | Status: DC
  Administered 2011-09-08: 1000 mL via INTRAVENOUS

## 2011-09-08 MED ORDER — PROPOFOL 10 MG/ML IV EMUL
INTRAVENOUS | Status: DC | PRN
  Administered 2011-09-08: 10 mg via INTRAVENOUS
  Administered 2011-09-08: 20 mg via INTRAVENOUS
  Administered 2011-09-08 (×2): 40 mg via INTRAVENOUS
  Administered 2011-09-08: 15 mg via INTRAVENOUS

## 2011-09-08 MED ORDER — FENTANYL CITRATE 0.05 MG/ML IJ SOLN
INTRAMUSCULAR | Status: DC | PRN
  Administered 2011-09-08: 50 ug via INTRAVENOUS

## 2011-09-08 MED ORDER — MIDAZOLAM HCL 5 MG/5ML IJ SOLN
INTRAMUSCULAR | Status: DC | PRN
  Administered 2011-09-08: 2 mg via INTRAVENOUS

## 2011-09-08 MED ORDER — BUTAMBEN-TETRACAINE-BENZOCAINE 2-2-14 % EX AERO
INHALATION_SPRAY | CUTANEOUS | Status: DC | PRN
  Administered 2011-09-08: 2 via TOPICAL

## 2011-09-08 NOTE — Interval H&P Note (Signed)
History and Physical Interval Note:  09/08/2011 9:53 AM  Frederick Maddox  has presented today for surgery, with the diagnosis of erosive gastritis  The various methods of treatment have been discussed with the patient and family. After consideration of risks, benefits and other options for treatment, the patient has consented to  Procedure(s) (LRB) with comments: FULL UPPER ENDOSCOPIC ULTRASOUND (EUS) RADIAL (N/A) - radia,l possible linea,r possible FNA as a surgical intervention .  The patient's history has been reviewed, patient examined, no change in status, stable for surgery.  I have reviewed the patient's chart and labs.  Questions were answered to the patient's satisfaction.     Jeremyah Maddox M  Assessment:  1.  Abnormal upper endoscopy (? "shelf" in proximal stomach) 2.  Weight loss. 3.  Recent diagnosis of adrenal insufficiency.  Plan:  1.  Endoscopic ultrasound with possible fine needle aspiration biopsies. 2.  Risks (bleeding, infection, bowel perforation that could require surgery, sedation-related changes in cardiopulmonary systems), benefits (identification and possible treatment of source of symptoms, exclusion of certain causes of symptoms), and alternatives (watchful waiting, radiographic imaging studies, empiric medical treatment) of upper endoscopy (EGD) were explained to patient in detail and he wishes to proceed.

## 2011-09-08 NOTE — Anesthesia Postprocedure Evaluation (Signed)
  Anesthesia Post-op Note  Patient: Frederick Maddox  Procedure(s) Performed: Procedure(s) (LRB): FULL UPPER ENDOSCOPIC ULTRASOUND (EUS) RADIAL (N/A)  Patient Location: PACU  Anesthesia Type: MAC  Level of Consciousness: awake and alert   Airway and Oxygen Therapy: Patient Spontanous Breathing  Post-op Pain: mild  Post-op Assessment: Post-op Vital signs reviewed, Patient's Cardiovascular Status Stable, Respiratory Function Stable, Patent Airway and No signs of Nausea or vomiting  Post-op Vital Signs: stable  Complications: No apparent anesthesia complications

## 2011-09-08 NOTE — Anesthesia Preprocedure Evaluation (Addendum)
Anesthesia Evaluation  Patient identified by MRN, date of birth, ID band Patient awake    Reviewed: Allergy & Precautions, H&P , NPO status , Patient's Chart, lab work & pertinent test results  Airway Mallampati: II TM Distance: >3 FB Neck ROM: Full    Dental No notable dental hx.    Pulmonary Current Smoker,  breath sounds clear to auscultation  Pulmonary exam normal       Cardiovascular negative cardio ROS  Rhythm:Regular Rate:Normal     Neuro/Psych PSYCHIATRIC DISORDERS Depression  Neuromuscular disease    GI/Hepatic negative GI ROS, Neg liver ROS, hiatal hernia, GERD-  Medicated,  Endo/Other  Type 2Adrenal insufficiency.  Renal/GU negative Renal ROS  negative genitourinary   Musculoskeletal negative musculoskeletal ROS (+)   Abdominal   Peds negative pediatric ROS (+)  Hematology negative hematology ROS (+)   Anesthesia Other Findings   Reproductive/Obstetrics negative OB ROS                          Anesthesia Physical Anesthesia Plan  ASA: III  Anesthesia Plan: MAC   Post-op Pain Management:    Induction: Intravenous  Airway Management Planned:   Additional Equipment:   Intra-op Plan:   Post-operative Plan: Extubation in OR  Informed Consent: I have reviewed the patients History and Physical, chart, labs and discussed the procedure including the risks, benefits and alternatives for the proposed anesthesia with the patient or authorized representative who has indicated his/her understanding and acceptance.   Dental advisory given  Plan Discussed with: CRNA  Anesthesia Plan Comments:         Anesthesia Quick Evaluation

## 2011-09-08 NOTE — Op Note (Signed)
[facility] [address] [place], [address]   ENDOSCOPIC ULTRASOUND PROCEDURE REPORT  PATIENT: Frederick, Maddox  MR#: [id number] BIRTHDATE: Sep 06, 1965  GENDER: Male ENDOSCOPIST: Willis Modena, MD REFERRED BY:  Renaye Rakers, M.D.  Charlott Rakes, M.D. PROCEDURE DATE:  09/08/2011 PROCEDURE:   Upper EUS ASA CLASS:      Class III INDICATIONS:   1.  gastric deformity on endoscopy, assess for submucosal lesion versus extrinsic compression. MEDICATIONS: MAC sedation, administered by CRNA and Cetacaine spray x 2  DESCRIPTION OF PROCEDURE:   After the risks benefits and alternatives of the procedure were  explained, informed consent was obtained. The patient was then placed in the left, lateral, decubitus postion and IV sedation was administered. Throughout the procedure, the patients blood pressure, pulse and oxygen saturations were monitored continuously.  Under direct visualization, the     endoscope was introduced through the mouth and advanced to the pylorus .  Water was used as necessary to provide an acoustic interface.  Upon completion of the imaging, water was removed and the patient was sent to the recovery room in satisfactory condition.    FINDINGS:      Proximal stomach visualized endoscopically.  There was a bit of angulation just distal to the GE junction, which may be physiologic versus tension from underlying diaphragm, but no obvious "shelf" was identified.  Ultrasonographically, via radial echoendoscope, the proximal gastric wall layers were seemingly normal and no submucosal lesion or extrinsic lesion was identified. Incidental limited views of left lobe of liver, abdominal aorta, gallbladder and pancreas were normal.  IMPRESSION:     As above.  No clear proximal gastric abnormality was identified.  RECOMMENDATIONS:     1.  Watch for potential complications of procedure. 2.  In light of negative CT scan, and negative EUS today, don't  see utility in further evaluation of the patient's proximal stomach, in absence of new or interval progression of symptoms. 3.  Follow-up with [facility] on as-needed basis.   _______________________________ Rosalie DoctorWillis Modena, MD 09/08/2011 10:38 AM   CC:

## 2011-09-08 NOTE — Transfer of Care (Signed)
Immediate Anesthesia Transfer of Care Note  Patient: Frederick Maddox  Procedure(s) Performed: Procedure(s) (LRB) with comments: FULL UPPER ENDOSCOPIC ULTRASOUND (EUS) RADIAL (N/A) - radia,l possible linea,r possible FNA  Patient Location: PACU  Anesthesia Type: MAC  Level of Consciousness: awake, sedated and patient cooperative  Airway & Oxygen Therapy: Patient Spontanous Breathing and Patient connected to nasal cannula oxygen  Post-op Assessment: Report given to PACU RN and Post -op Vital signs reviewed and stable  Post vital signs: Reviewed and stable  Complications: No apparent anesthesia complications

## 2011-09-08 NOTE — H&P (View-Only) (Signed)
[facility] History and Physical  Nyle D Spada MRN:[id number] DOB: 03/10/1965 DOA: 08/22/2011  Referring physician: Dr. Wallace PCP: BLAND,VEITA J, MD   Chief Complaint: Blood pressure dropping when I stand up  HPI:  The patient is a 46-year-old male with history of DM dx 2 years ago, depression, tobacco use, and GERD who presents with lightheadedness while sitting up or standing.  He has been ill since two years ago when he was diagnosed with DM after which time he lost almost 100lbs which he attributes to uncontrolled depression and not eating.  About two months ago, he was seen by his primary care doctor who was concerned about abdominal cramps in the setting of weight loss and referred him for colonoscopy.  Six weeks ago, he started having episodes of lightheadedness/presyncope requiring him to lie down.  He was admitted to [facility] 8/2 after he progressed to a syncopal episode.  He was found to be anemia with iron and B12 deficiency and started on iron and B12 PO.  He underwent EGD and colonoscopy which revealed blood clot in the stomach and two colonic polyps which were resected.  Since then, he has had progressive lightheadedness and dizziness with sitting and standing.  His aunt has been taking his blood pressures, which were reviewed and reveal >20mmHg drop in BP and > 20bpm increase in HR.  His PCP prescribed megace and florinef, which he started taking two days ago.  This morning, he felt lightheaded even lying in bed.  He came to the ER because he was unable to complete ADLS.    In the ER, he was orthostatic and given 2L of IVF.  His hemoglobin has recovered somewhat from his previous admission.     Review of Systems:  Chills, but no fever, no night sweats, no nausea, vomiting.  Diarrhea 4 times per week and twice it is really watery and other times soft like pudding.  Occasionally has a more formed stool, about once every five to six stools, but no visible blood and no black  tarry stools.  Urinates a lot, but denies dysuria.  Denies lymphanopathy, abnormal bruising, and depression is currently well controlled.    Past Medical History  Diagnosis Date  . Diabetes mellitus   . Depression   . Colonic polyp August 2013    Two removed, pathology pending  . Hiatal hernia   . GERD (gastroesophageal reflux disease)   . Tobacco use   . H/O hypotestosteronemia    Past Surgical History  Procedure Date  . Polypectomy    Social History:  reports that he has been smoking Cigarettes.  He has been smoking about .5 packs per day. He does not have any smokeless tobacco history on file. He reports that he drinks about .5 ounces of alcohol per week. He reports that he does not use illicit drugs. Lives alone but he has been staying with your aunt who is helping him by getting him to eat.  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    No Known Allergies  Family History  Problem Relation Age of Onset  . Diabetes type II Mother     many family members  . Hypertension Brother     many family members  . Breast cancer Maternal Grandmother   . Breast cancer Cousin   . Brain cancer Maternal Grandmother     Prior to Admission medications   Medication Sig Start Date End Date Taking? Authorizing Provider    acetaminophen (TYLENOL) 500 MG tablet Take 1,000 mg by mouth every 6 (six) hours as needed. pain   Yes Historical Provider, MD  docusate sodium (COLACE) 100 MG capsule Take 100 mg by mouth daily.   Yes Historical Provider, MD  ferrous sulfate 325 (65 FE) MG tablet Take 325 mg by mouth daily with breakfast. 08/08/11 08/07/12 Yes Clanford L Johnson, MD  fludrocortisone (FLORINEF) 0.1 MG tablet Take 0.1 mg by mouth every morning.   Yes Historical Provider, MD  HYDROcodone-acetaminophen (VICODIN) 5-500 MG per tablet Take 1 tablet by mouth every 6 (six) hours as needed. pain   Yes Historical Provider, MD  megestrol (MEGACE ES) 625 MG/5ML  suspension Take 625 mg by mouth daily.   Yes Historical Provider, MD  omeprazole (PRILOSEC) 20 MG capsule Take 20 mg by mouth daily. 08/08/11 08/07/12 Yes Clanford L Johnson, MD  Testosterone (ANDROGEL) 20.25 MG/1.25GM (1.62%) GEL Place 2 application onto the skin daily. 2 pump each day to upper arm and chest (1 to left side, 1 to right side)   Yes Historical Provider, MD  Vilazodone HCl (VIIBRYD) 40 MG TABS Take 40 mg by mouth daily.   Yes Historical Provider, MD  vitamin B-12 (CYANOCOBALAMIN) 1000 MCG tablet Take 1,000 mcg by mouth daily. 08/08/11 08/07/12 Yes Clanford L Johnson, MD   Physical Exam: Filed Vitals:   08/22/11 1730 08/22/11 1830 08/22/11 1930 08/22/11 2000  BP: 118/77 123/82 118/77 119/81  Pulse: 96 93 88 97  Temp:      TempSrc:      Resp: 20 20 21 12  SpO2: 100% 100% 100% 100%     General:  Cachectic AAM, no acute distress, lying in bed  Eyes: PERRL, anicteric, noninjected  ENT: nares and OP clear  Neck: supple, no discernable JVP  Cardiovascular: RRR, no murmurs, rubs, gallops, or skipped beats.    Respiratory: CTAB  Abdomen: NABS, soft, nondistended, nontender, no organomegaly  Skin: Scar on left shin  Musculoskeletal: Normal tone and bulk  Psychiatric: A&O x 4, normal affect  Neurologic: II-XII intact, strength 5/5 throughout, sensation intact to light touch, no dysmetria  Lymph:  No cervical or supraclavicular LAD  Labs on Admission:  Basic Metabolic Panel:  Lab 08/22/11 1322  NA 143  K 3.8  CL 105  CO2 --  GLUCOSE 95  BUN 7  CREATININE 0.90  CALCIUM --  MG --  PHOS --   Liver Function Tests: No results found for this basename: AST:5,ALT:5,ALKPHOS:5,BILITOT:5,PROT:5,ALBUMIN:5 in the last 168 hours No results found for this basename: LIPASE:5,AMYLASE:5 in the last 168 hours No results found for this basename: AMMONIA:5 in the last 168 hours CBC:  Lab 08/22/11 1322 08/22/11 1310  WBC -- 10.3  NEUTROABS -- 6.4  HGB 10.9* 9.4*  HCT 32.0*  29.2*  MCV -- 74.1*  PLT -- 382   Cardiac Enzymes: No results found for this basename: CKTOTAL:5,CKMB:5,CKMBINDEX:5,TROPONINI:5 in the last 168 hours  BNP (last 3 results) No results found for this basename: PROBNP:3 in the last 8760 hours CBG:  Lab 08/22/11 1444  GLUCAP 99    Radiological Exams on Admission: No results found.  EKG: Independently reviewed. Sinus tachycardia  Assessment/Plan Principal Problem:  *Orthostatic hypotension Active Problems:  Weight loss  Diabetes mellitus  Depression  Iron deficiency anemia  B12 deficiency  Frequent loose stools  Orthostatic hypotension:  Differential includes dysrhythmia, CHF, adrenal insufficiency, familial dysautonomia, POTS, autoimmune autonomic ganglionopathy (particularly given the concurrent GI symptoms).  Most likely, patient has a degree   of dehydration with either adrenal insufficiency or POTS.  He has not had longstanding diabetes and his A1c is well controlled.  No symptoms of CHF and no cardiomegaly on recent CXR 8/2.  B12 deficiency may also cause orthostasis.  -  AM cortisol level -  Telemetry -  OOB only with assistance -  Fall precautions -  PT consult -  Cardiology consult -   Continue florinef (rx empirically by PCP)  Weight loss:  Likely due to depression, but differential includes malabsorptive disorders such as celiac, malignancy, and certain infections, such as HIV and TB -   Anti-tTG IgA, IgA -   HIV -   Quantiferon gold -   Continue Megace  Loose stools:  DDx includes IBS, IBD, celiac, and less likely infection.  Per patient, no evidence of celiac or crohn's on recent scopes.   -   Celiac titers as above -   Hold colace  Anemia, improving -   Continue B12 and iron  B12 deficiency:  Likely due to poor diet vs. Malabsorption -    Consider anti-IF and anti-parietal cell as outpatient  GERD:  Stable. Continue Omeprazole  Depression, stable, hold home medication (not on formulary)  DIET:   Diabetic ACCESS:  PIV PROPH:  Lovenox  Code Status: Full Family Communication: Plan discussed with patient, aunt, and brother Disposition Plan: Pending results of cortisol   Time spent: 60  Perfecto Purdy [facility] Pager [phone]  If 7PM-7AM, please contact night-coverage [email or web address] Password [email or web address] 08/22/2011, 8:11 PM       

## 2011-09-09 ENCOUNTER — Encounter (HOSPITAL_COMMUNITY): Payer: Self-pay | Admitting: Gastroenterology

## 2011-09-23 ENCOUNTER — Other Ambulatory Visit: Payer: Self-pay | Admitting: Family Medicine

## 2011-09-23 DIAGNOSIS — M7989 Other specified soft tissue disorders: Secondary | ICD-10-CM

## 2011-09-27 ENCOUNTER — Ambulatory Visit
Admission: RE | Admit: 2011-09-27 | Discharge: 2011-09-27 | Disposition: A | Discharge status: Home / Self Care | Payer: Managed Care, Other (non HMO) | Source: Ambulatory Visit | Attending: Family Medicine | Admitting: Family Medicine

## 2011-09-27 DIAGNOSIS — M7989 Other specified soft tissue disorders: Secondary | ICD-10-CM

## 2011-10-05 ENCOUNTER — Other Ambulatory Visit: Payer: Self-pay | Admitting: Family Medicine

## 2011-10-05 DIAGNOSIS — M7989 Other specified soft tissue disorders: Secondary | ICD-10-CM

## 2011-10-11 ENCOUNTER — Ambulatory Visit
Admission: RE | Admit: 2011-10-11 | Discharge: 2011-10-11 | Disposition: A | Discharge status: Home / Self Care | Payer: Managed Care, Other (non HMO) | Source: Ambulatory Visit | Attending: Family Medicine | Admitting: Family Medicine

## 2011-10-11 DIAGNOSIS — M7989 Other specified soft tissue disorders: Secondary | ICD-10-CM

## 2011-10-11 MED ORDER — GADOBENATE DIMEGLUMINE 529 MG/ML IV SOLN
15.0000 mL | Freq: Once | INTRAVENOUS | Status: AC | PRN
  Administered 2011-10-11: 15 mL via INTRAVENOUS

## 2012-06-30 ENCOUNTER — Telehealth: Payer: Self-pay | Admitting: Hematology & Oncology

## 2012-06-30 NOTE — Telephone Encounter (Signed)
Left pt message to call and schedule appointment. Left with Tresa Endo and Delice Bison to call next week if he doesn't call back to schedule

## 2012-07-03 ENCOUNTER — Telehealth: Payer: Self-pay | Admitting: Hematology & Oncology

## 2012-07-03 NOTE — Telephone Encounter (Signed)
New patient apt was sch for 08/07/12.  Patient was called and given apt date/time.  He was made aware of location and the length of apt.  He stated, he will be here.

## 2012-07-05 ENCOUNTER — Encounter: Payer: Self-pay | Admitting: Endocrinology

## 2012-07-05 ENCOUNTER — Ambulatory Visit (INDEPENDENT_AMBULATORY_CARE_PROVIDER_SITE_OTHER): Payer: Managed Care, Other (non HMO) | Admitting: Endocrinology

## 2012-07-05 VITALS — BP 124/84 | HR 78 | Resp 12 | Ht 73.0 in | Wt 167.6 lb

## 2012-07-05 DIAGNOSIS — E2749 Other adrenocortical insufficiency: Secondary | ICD-10-CM

## 2012-07-05 LAB — BASIC METABOLIC PANEL
Calcium: 9.6 mg/dL (ref 8.4–10.5)
GFR: 76.68 mL/min (ref >60.00)
Potassium: 3.3 mEq/L — ABNORMAL LOW (ref 3.5–5.1)
Sodium: 142 mEq/L (ref 135–145)

## 2012-07-05 MED ORDER — POTASSIUM CHLORIDE CRYS ER 20 MEQ PO TBCR: 20.0000 meq | EXTENDED_RELEASE_TABLET | Freq: Every day | ORAL | Status: DC

## 2012-07-05 NOTE — Progress Notes (Signed)
Patient ID: Frederick Maddox, male   DOB: August 25, 1965, 47 y.o.   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 He had episodes of feeling lightheaded when standing up. He had 2 admissions to the hospital with these symptoms. Also had decreased appetite, but no nausea; had persistent diarrhea for 3-4 months. He had lost a total of 65 pounds over several months prior to treatment. Prior testing includes AM cortisol of 1.2 on 8/19 and 5.4 on 8/20, Cortrosyn test diagnostic with 30 minute level of 13 and 1 hour of 17, ACTH level 5 (drawn at 2 pm same afternoon).  He had an episode of addisonian crisis in 2/14 from stopping his medications on his own.   RECENT history: He was complaining of fatigue and lack of energy on his last visit but has not complained of the same today. After restarting his medications in February he was given higher doses temporarily. Because of his fatigue and general malaise his hydrocortisone was increased from 20 mg to 30 mg in the morning when he could not taper down after subsequently . He has not had any unusual weight gain, in fact he is only complaining about lack of weight gain. Does not have any nausea or decreased appetite. Does have diarrhea which is chronic. He is also taking Florinef daily and does not feel lightheaded. Electrolytes are pending from today. Compliance with taking his medications has been Is very good now, as prescribed. He understands the need for long-term therapy .  REVIEW of systems: He was  having difficulty with insomnia for 3-4 months and has a history of depression which is under treatment . Diarrhea is occurring 2-4x daily and previously has been seen by gastroenterologist readings  sometimes diarrhea is worse when eating sugar. He does check his sugar on his own and is usually normal  ERECTILE dysfunction: He has not had any significant hypogonadism to explain this. He still uses Cialis but with incomplete success. Complaining about the cost  EXAM:   BP 124/84  Pulse 78  Resp 12  Ht 6\' 1"  (1.854 m)  Wt 76.023 kg (167 lb 9.6 oz)  BMI 22.12 kg/m2  SpO2 98%  Blood pressure was checked twice on standing up. He had normal affect He does not have any excessive pigmentation of his extremities. No pallor or dry mucous membranes. No pedal edema    Medication List       This list is accurate as of: 07/05/12 11:15 AM.  Always use your most recent med list.               acetaminophen 500 MG tablet  Commonly known as:  TYLENOL  Take 1,000 mg by mouth every 6 (six) hours as needed. pain     ferrous sulfate 325 (65 FE) MG tablet  Take 325 mg by mouth daily with breakfast.     fludrocortisone 0.1 MG tablet  Commonly known as:  FLORINEF  Take 0.1 mg by mouth every morning.     HYDROcodone-acetaminophen 5-500 MG per tablet  Commonly known as:  VICODIN  Take 1 tablet by mouth every 6 (six) hours as needed. pain     hydrocortisone 20 MG tablet  Commonly known as:  CORTEF  20 mg. Take 1 1/2 tablet by mouth every morning and 1/2 tablet at 5 pm.     multivitamin with minerals Tabs  Take 1 tablet by mouth daily.     omeprazole 20 MG capsule  Commonly known as:  PRILOSEC  Take 20 mg by mouth daily.     VIIBRYD 40 MG Tabs  Generic drug:  Vilazodone HCl  Take 40 mg by mouth daily.     vitamin B-12 1000 MCG tablet  Commonly known as:  CYANOCOBALAMIN  Take 1,000 mcg by mouth daily.       Assessment/plan: Secondary adrenal insufficiency. Not clear why his blood pressure is slightly high today on my exam. However he is still requiring somewhat high doses of hydrocortisone for supplementation reassured about the diarrhea is not related to Addison's disease since  he is taking more than the usual replacement dose. His appetite, energy level and weight are stable. Since his blood pressure is relatively high will reduce the Florinef to 5 times a week and followup in 3 months. Basic metabolic panel is done today Erectile dysfunction:He may have  effects from  depression medication and psychological factors but also needs to discuss with PCP Diarrhea: He needs to followup with gastroenterologist  this needs further evaluation and is unrelated to his Addison's disease and he will call to make appointment  Addendum: His potassium is 3.3, possibly from taking Florinef and also diarrhea, have sent a new prescription for potassium chloride 20 mEq

## 2012-07-05 NOTE — Patient Instructions (Addendum)
Florinef 5 days a week, LEAVE OFF on Mon Thursdays. Continue hydrocortisone as before. Follow up with Dr Bosie Clos for your diarrhea Discuss other problems the primary care physician

## 2012-08-07 ENCOUNTER — Other Ambulatory Visit (HOSPITAL_BASED_OUTPATIENT_CLINIC_OR_DEPARTMENT_OTHER): Payer: Managed Care, Other (non HMO) | Admitting: Lab

## 2012-08-07 ENCOUNTER — Ambulatory Visit: Payer: Managed Care, Other (non HMO)

## 2012-08-07 ENCOUNTER — Ambulatory Visit (HOSPITAL_BASED_OUTPATIENT_CLINIC_OR_DEPARTMENT_OTHER): Payer: Managed Care, Other (non HMO) | Admitting: Hematology & Oncology

## 2012-08-07 VITALS — BP 123/83 | HR 78 | Temp 98.1°F | Resp 16 | Ht 72.5 in | Wt 164.0 lb

## 2012-08-07 DIAGNOSIS — D509 Iron deficiency anemia, unspecified: Secondary | ICD-10-CM

## 2012-08-07 DIAGNOSIS — E538 Deficiency of other specified B group vitamins: Secondary | ICD-10-CM

## 2012-08-07 LAB — IRON AND TIBC CHCC
Iron: 33 ug/dL — ABNORMAL LOW (ref 42–163)
TIBC: 399 ug/dL (ref 202–409)
UIBC: 365 ug/dL (ref 117–376)

## 2012-08-07 LAB — CBC WITH DIFFERENTIAL (CANCER CENTER ONLY)
BASO#: 0 10*3/uL (ref 0.0–0.2)
BASO%: 0.5 % (ref 0.0–2.0)
EOS%: 1.2 % (ref 0.0–7.0)
HCT: 28.4 % — ABNORMAL LOW (ref 38.7–49.9)
HGB: 9.2 g/dL — ABNORMAL LOW (ref 13.0–17.1)
LYMPH#: 3.1 10*3/uL (ref 0.9–3.3)
MONO#: 0.5 10*3/uL (ref 0.1–0.9)
NEUT#: 4.5 10*3/uL (ref 1.5–6.5)
NEUT%: 54.7 % (ref 40.0–80.0)
WBC: 8.2 10*3/uL (ref 4.0–10.0)

## 2012-08-07 LAB — FERRITIN CHCC: Ferritin: 257 ng/ml (ref 22–316)

## 2012-08-07 NOTE — Progress Notes (Signed)
This office note has been dictated.

## 2012-08-07 NOTE — Progress Notes (Signed)
New pt

## 2012-08-07 NOTE — Addendum Note (Signed)
Addended by: Arlan Organ R on: 08/07/2012 05:49 PM   Modules accepted: Orders

## 2012-08-08 ENCOUNTER — Telehealth: Payer: Self-pay | Admitting: Hematology & Oncology

## 2012-08-08 NOTE — Telephone Encounter (Signed)
Pt aware of 8-7 iron

## 2012-08-08 NOTE — Progress Notes (Signed)
CC:   Sigmund Hazel, M.D.  DIAGNOSIS:  Microcytic anemia.  HISTORY OF PRESENT ILLNESS:  Frederick Maddox is a very nice 47 year old African American gentleman.  He is followed by Dr. Sigmund Hazel.  He has had issues ever since his mother passed away a couple years ago. He had weighed 220 pounds.  He now is down to 164.  He has been noted to be anemic.  He has had an extensive workup for this.  So far, it looks like iron deficiency has been the problem.  Back in August of 2013, his ferritin was 279 with an iron saturation of only 4%.  His total iron was 14. He has been evaluated with CT of the abdomen and pelvis.  Everything looked okay on the CT of the abdomen and pelvis.  He did have an upper and lower endoscopy.  He even had an endoscopic ultrasound.  I think he has been having some diarrhea issues. Endoscopic ultrasound was fairly unremarkable.  He did have a colonoscopy which looked okay.  I think he has had a couple of polyps removed.  He has been checked for HIV.  This has been negative.  He was found to have Addison's disease.  He is on hydrocortisone for this.  A B12 level was done on him back in August 2013.  This was mildly suppressed at 201.  He is taking the oral iron.  He is tired.  He is trying to workout but does not have a lot of energy.  He is still working although he does get quite tired with work.  He has had no bleeding.  He has had no nausea or vomiting.  He has had no cough or shortness of breath.  He was kindly referred to the [facility] to try to help evaluate his anemia.  PAST MEDICAL HISTORY: 1. Iron-deficiency anemia. 2. Addison's disease. 3. Depression. 4. Non insulin-dependent diabetes. 5. Chronic diarrhea.  ALLERGIES:  None.  MEDICATIONS: 1. Atropine eyedrop 1% to the right eye 1 drop daily. 2. Iron sulfate 325 mg p.o. daily. 3. Florinef 0.1 mg p.o. q.a.m. 4. Hydrocortisone 10 mg p.o. b.i.d. 5. Vicodin (5/500) 1 p.o. q.6  hours p.r.n. 6. Prilosec 20 mg p.o. daily. 7. Viibryd 40 mg p.o. daily.  SOCIAL HISTORY:  Negative for tobacco or alcohol use.  He has no obvious occupational exposures.  FAMILY HISTORY:  Negative for any blood disorder.  There is no obvious family history of malignancy.  REVIEW OF SYSTEMS:  As stated in history of present illness.  No additional findings are noted on a 12-system review.  PHYSICAL EXAMINATION:  General:  This is a thin African American gentleman in no obvious distress.  Vital signs:  Temperature of 98.1, pulse 78, respiratory rate 16, blood pressure 123/83.  Weight is 164. Head and neck:  Normocephalic, atraumatic skull.  There are no ocular or oral lesions.  There are no palpable cervical or supraclavicular lymph nodes.  Lungs:  Clear bilaterally.  Cardiac:  Regular rate and rhythm with a normal S1, S2.  There are no murmurs, rubs or bruits.  Abdomen: Soft.  He has good bowel sounds.  There is no fluid wave.  There is no palpable hepatosplenomegaly.  Back:  No tenderness over the spine, ribs, or hips.  Axillary:  Shows no bilateral axillary adenopathy. Extremities:  Show no clubbing, cyanosis or edema.  He has good range motion of his joints.  Skin:  Shows no rashes, ecchymosis, or petechia. Neurological:  Shows no focal neurological deficits.  He has no tingling in his hands or feet.  LABORATORY STUDIES:  White cell count is 8.2, hemoglobin 9.2, hematocrit 28.4, platelet count 188.  MCV is 79.  White cell differential shows 55 segs, 37 lymphocytes, 6 monos.  Peripheral smear shows some mild anisocytosis and poikilocytosis.  There is significant microcytic red cells.  I saw no teardrop cells.  There were no target cells.  He had no polychromasia.  Rouleaux formation was not noted.  White cells appear normal in morphology and maturation.  I do not see any hypersegmented polys.  There is no immature myeloid or lymphoid forms.  I see no atypical lymphocytes.   Platelets are adequate in number and size.  IMPRESSION:  [name] is a very nice 47 year old African American gentleman with mild microcytic anemia.  One has to suspect that he is still iron deficient.  I saw that his B12 level was on the lower side a year ago.  I do not believe that B12 deficiency should be a problem for [place].  We will see what his iron studies show.  If still low, then I will have to put him on IV iron to try to replace his stores.  This worked quite well.  I do not see anything on the blood smear that would suggest a bone marrow disorder.  I do not see a need for a bone marrow test on him.  I do not think we need to do any x-rays on him.  I am just willing to see what his iron studies show.  Again, if they are low, then I would give him IV iron to replace his depleted stores.  We will be in touch with Mr. Lezotte once his iron studies and other lab work comes back.  I spent a good hour or so with Mr. Ritson.  He is a real nice guy.  He is very well spoken.    ______________________________ Josph Macho, M.D. PRE/MEDQ  D:  08/07/2012  T:  08/08/2012  Job:  [id number]

## 2012-08-10 ENCOUNTER — Ambulatory Visit (HOSPITAL_BASED_OUTPATIENT_CLINIC_OR_DEPARTMENT_OTHER): Payer: Managed Care, Other (non HMO)

## 2012-08-10 VITALS — BP 110/63 | HR 88 | Temp 99.3°F | Resp 16

## 2012-08-10 DIAGNOSIS — D509 Iron deficiency anemia, unspecified: Secondary | ICD-10-CM

## 2012-08-10 LAB — HAPTOGLOBIN: Haptoglobin: 79 mg/dL (ref 45–215)

## 2012-08-10 LAB — HEMOGLOBINOPATHY EVALUATION
Hemoglobin Other: 0 %
Hgb A2 Quant: 2.3 % (ref 2.2–3.2)
Hgb A: 97.7 % (ref 96.8–97.8)
Hgb S Quant: 0 %

## 2012-08-10 LAB — RETICULOCYTES (CHCC): Retic Ct Pct: 0.6 % (ref 0.4–2.3)

## 2012-08-10 MED ORDER — SODIUM CHLORIDE 0.9 % IV SOLN
Freq: Once | INTRAVENOUS | Status: AC
  Administered 2012-08-10: 14:00:00 via INTRAVENOUS

## 2012-08-10 MED ORDER — SODIUM CHLORIDE 0.9 % IV SOLN
1020.0000 mg | Freq: Once | INTRAVENOUS | Status: AC
  Administered 2012-08-10: 1020 mg via INTRAVENOUS
  Filled 2012-08-10: qty 34

## 2012-08-10 NOTE — Patient Instructions (Addendum)
Ferumoxytol injection What is this medicine? FERUMOXYTOL is an iron complex. Iron is used to make healthy red blood cells, which carry oxygen and nutrients throughout the body. This medicine is used to treat iron deficiency anemia in people with chronic kidney disease. This medicine may be used for other purposes; ask your health care provider or pharmacist if you have questions. What should I tell my health care provider before I take this medicine? They need to know if you have any of these conditions: -anemia not caused by low iron levels -high levels of iron in the blood -magnetic resonance imaging (MRI) test scheduled -an unusual or allergic reaction to iron, other medicines, foods, dyes, or preservatives -pregnant or trying to get pregnant -breast-feeding How should I use this medicine? This medicine is for infusion into a vein. It is given by a health care professional in a hospital or clinic setting. Talk to your pediatrician regarding the use of this medicine in children. Special care may be needed. Overdosage: If you think you've taken too much of this medicine contact a poison control center or emergency room at once. Overdosage: If you think you have taken too much of this medicine contact a poison control center or emergency room at once. NOTE: This medicine is only for you. Do not share this medicine with others. What if I miss a dose? It is important not to miss your dose. Call your doctor or health care professional if you are unable to keep an appointment. What may interact with this medicine? This medicine may interact with the following medications: -other iron products This list may not describe all possible interactions. Give your health care provider a list of all the medicines, herbs, non-prescription drugs, or dietary supplements you use. Also tell them if you smoke, drink alcohol, or use illegal drugs. Some items may interact with your medicine. What should I watch  for while using this medicine? Visit your doctor or healthcare professional regularly. Tell your doctor or healthcare professional if your symptoms do not start to get better or if they get worse. You may need blood work done while you are taking this medicine. You may need to follow a special diet. Talk to your doctor. Foods that contain iron include: whole grains/cereals, dried fruits, beans, or peas, leafy green vegetables, and organ meats (liver, kidney). What side effects may I notice from receiving this medicine? Side effects that you should report to your doctor or health care professional as soon as possible: -allergic reactions like skin rash, itching or hives, swelling of the face, lips, or tongue -breathing problems -changes in blood pressure -feeling faint or lightheaded, falls -fever or chills -flushing, sweating, or hot feelings -swelling of the ankles or feet Side effects that usually do not require medical attention (Report these to your doctor or health care professional if they continue or are bothersome.): -diarrhea -headache -nausea, vomiting -stomach pain This list may not describe all possible side effects. Call your doctor for medical advice about side effects. You may report side effects to FDA at 1-800-FDA-1088. Where should I keep my medicine? This drug is given in a hospital or clinic and will not be stored at home. NOTE: This sheet is a summary. It may not cover all possible information. If you have questions about this medicine, talk to your doctor, pharmacist, or health care provider.  2013, [employer]. (09/13/2007 9:48:25 PM)  

## 2012-08-31 ENCOUNTER — Other Ambulatory Visit: Payer: Self-pay | Admitting: Family Medicine

## 2012-08-31 DIAGNOSIS — M79652 Pain in left thigh: Secondary | ICD-10-CM

## 2012-09-01 ENCOUNTER — Other Ambulatory Visit: Payer: Managed Care, Other (non HMO)

## 2012-09-07 ENCOUNTER — Telehealth: Payer: Self-pay | Admitting: Hematology & Oncology

## 2012-09-07 NOTE — Telephone Encounter (Signed)
Patient called to cancel his appt on 9/8 due to being hospitalized at this time. Will cb w he has been discharged.

## 2012-09-11 ENCOUNTER — Ambulatory Visit: Payer: Managed Care, Other (non HMO) | Admitting: Hematology & Oncology

## 2012-09-11 ENCOUNTER — Other Ambulatory Visit: Payer: Managed Care, Other (non HMO) | Admitting: Lab

## 2012-09-11 ENCOUNTER — Telehealth: Payer: Self-pay | Admitting: Endocrinology

## 2012-09-11 NOTE — Telephone Encounter (Signed)
Patient was admitted to the hospital and currently is orthostatic with blood pressure 70 standing on 0.1 mg Florinef Electrolytes normal Discussed that temporarily can increase Florinef to 0.2

## 2012-10-09 ENCOUNTER — Ambulatory Visit: Payer: Managed Care, Other (non HMO) | Admitting: Endocrinology

## 2012-10-09 DIAGNOSIS — Z0289 Encounter for other administrative examinations: Secondary | ICD-10-CM

## 2012-11-27 ENCOUNTER — Other Ambulatory Visit: Payer: Self-pay | Admitting: *Deleted

## 2012-11-27 MED ORDER — FLUDROCORTISONE ACETATE 0.1 MG PO TABS: ORAL_TABLET | ORAL | Status: DC

## 2014-02-11 ENCOUNTER — Other Ambulatory Visit: Payer: Self-pay | Admitting: Family

## 2014-06-06 IMAGING — CR DG CHEST 2V
2 series · 2 of 2 positions shown · non-contrast
Comparison: None.

CLINICAL DATA: Hypotension.  Weight loss

CHEST - 2 VIEW

[w chest pa]
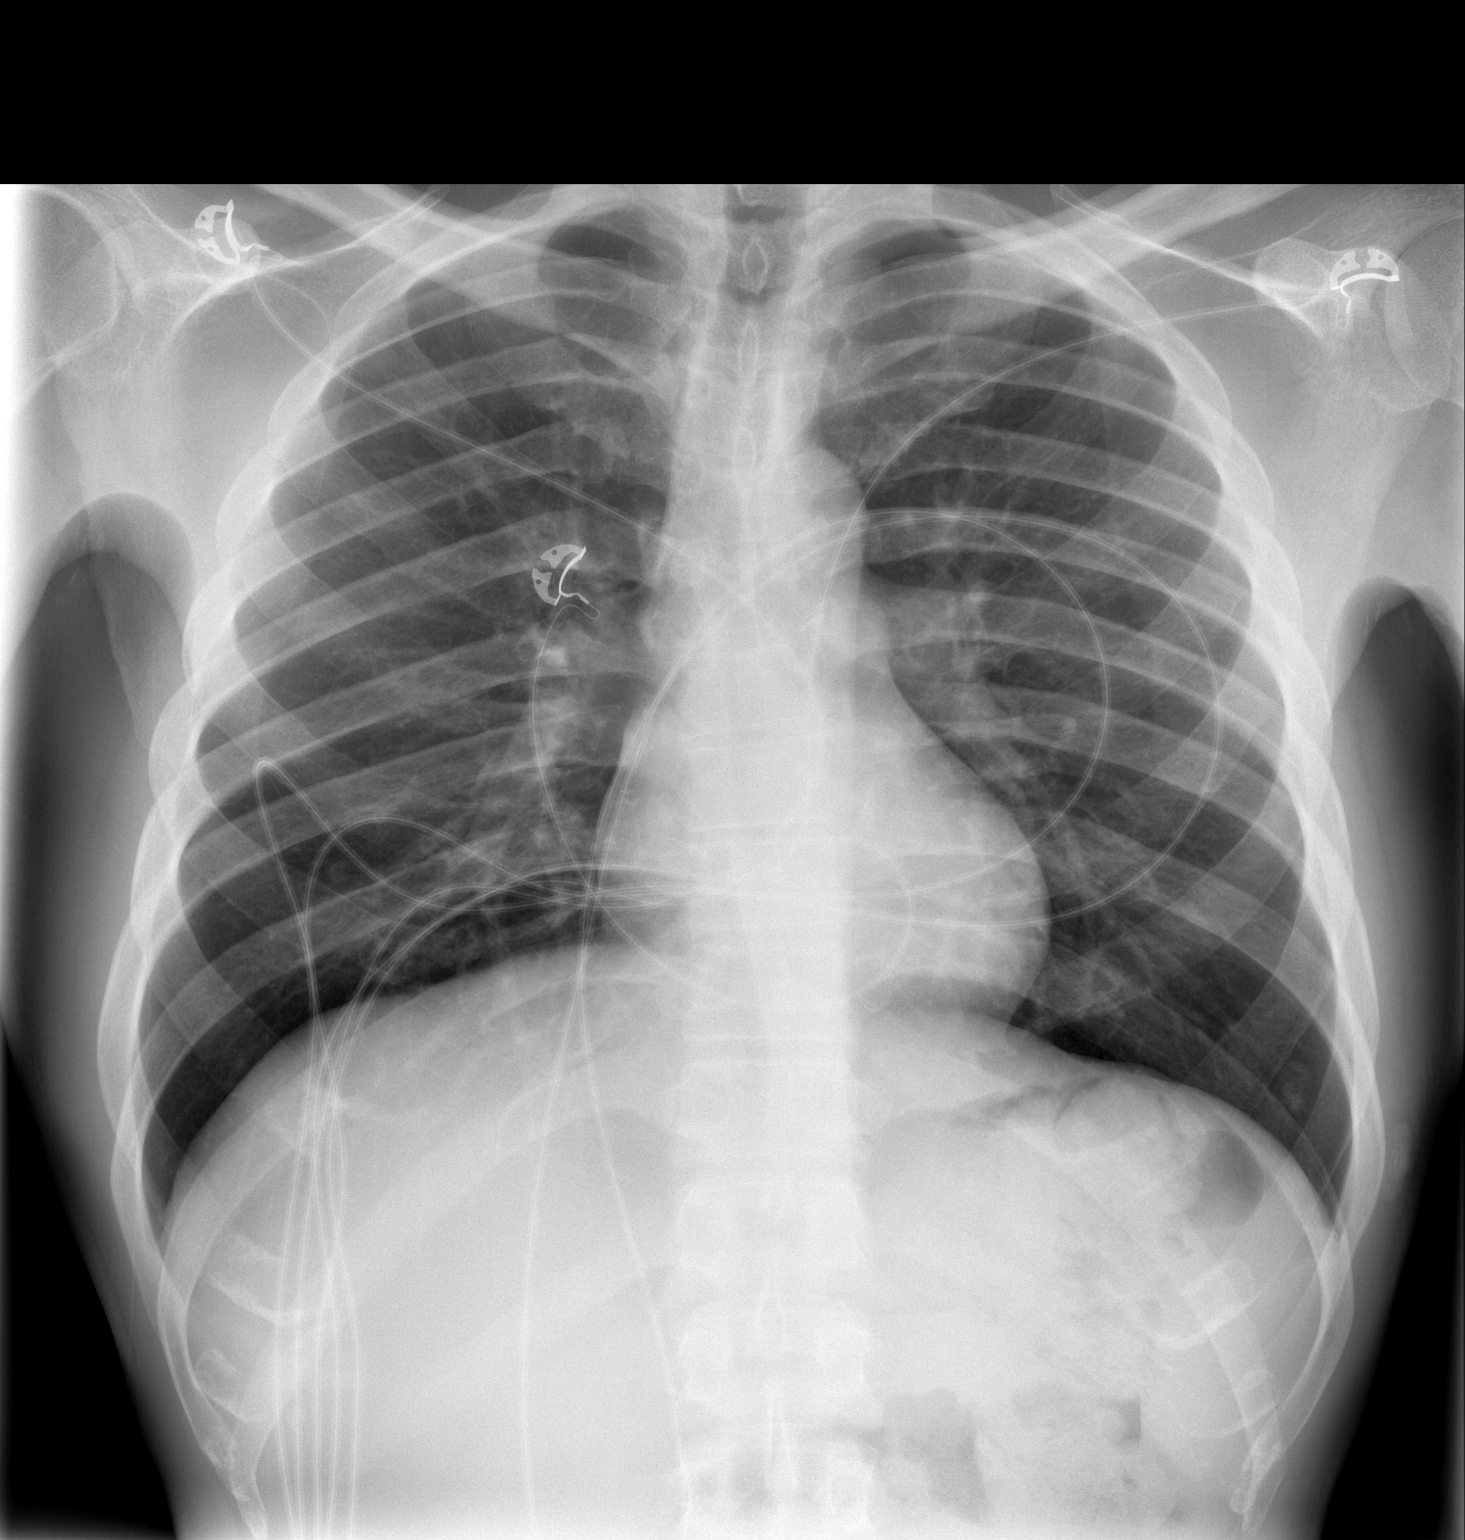

[w chest lat]
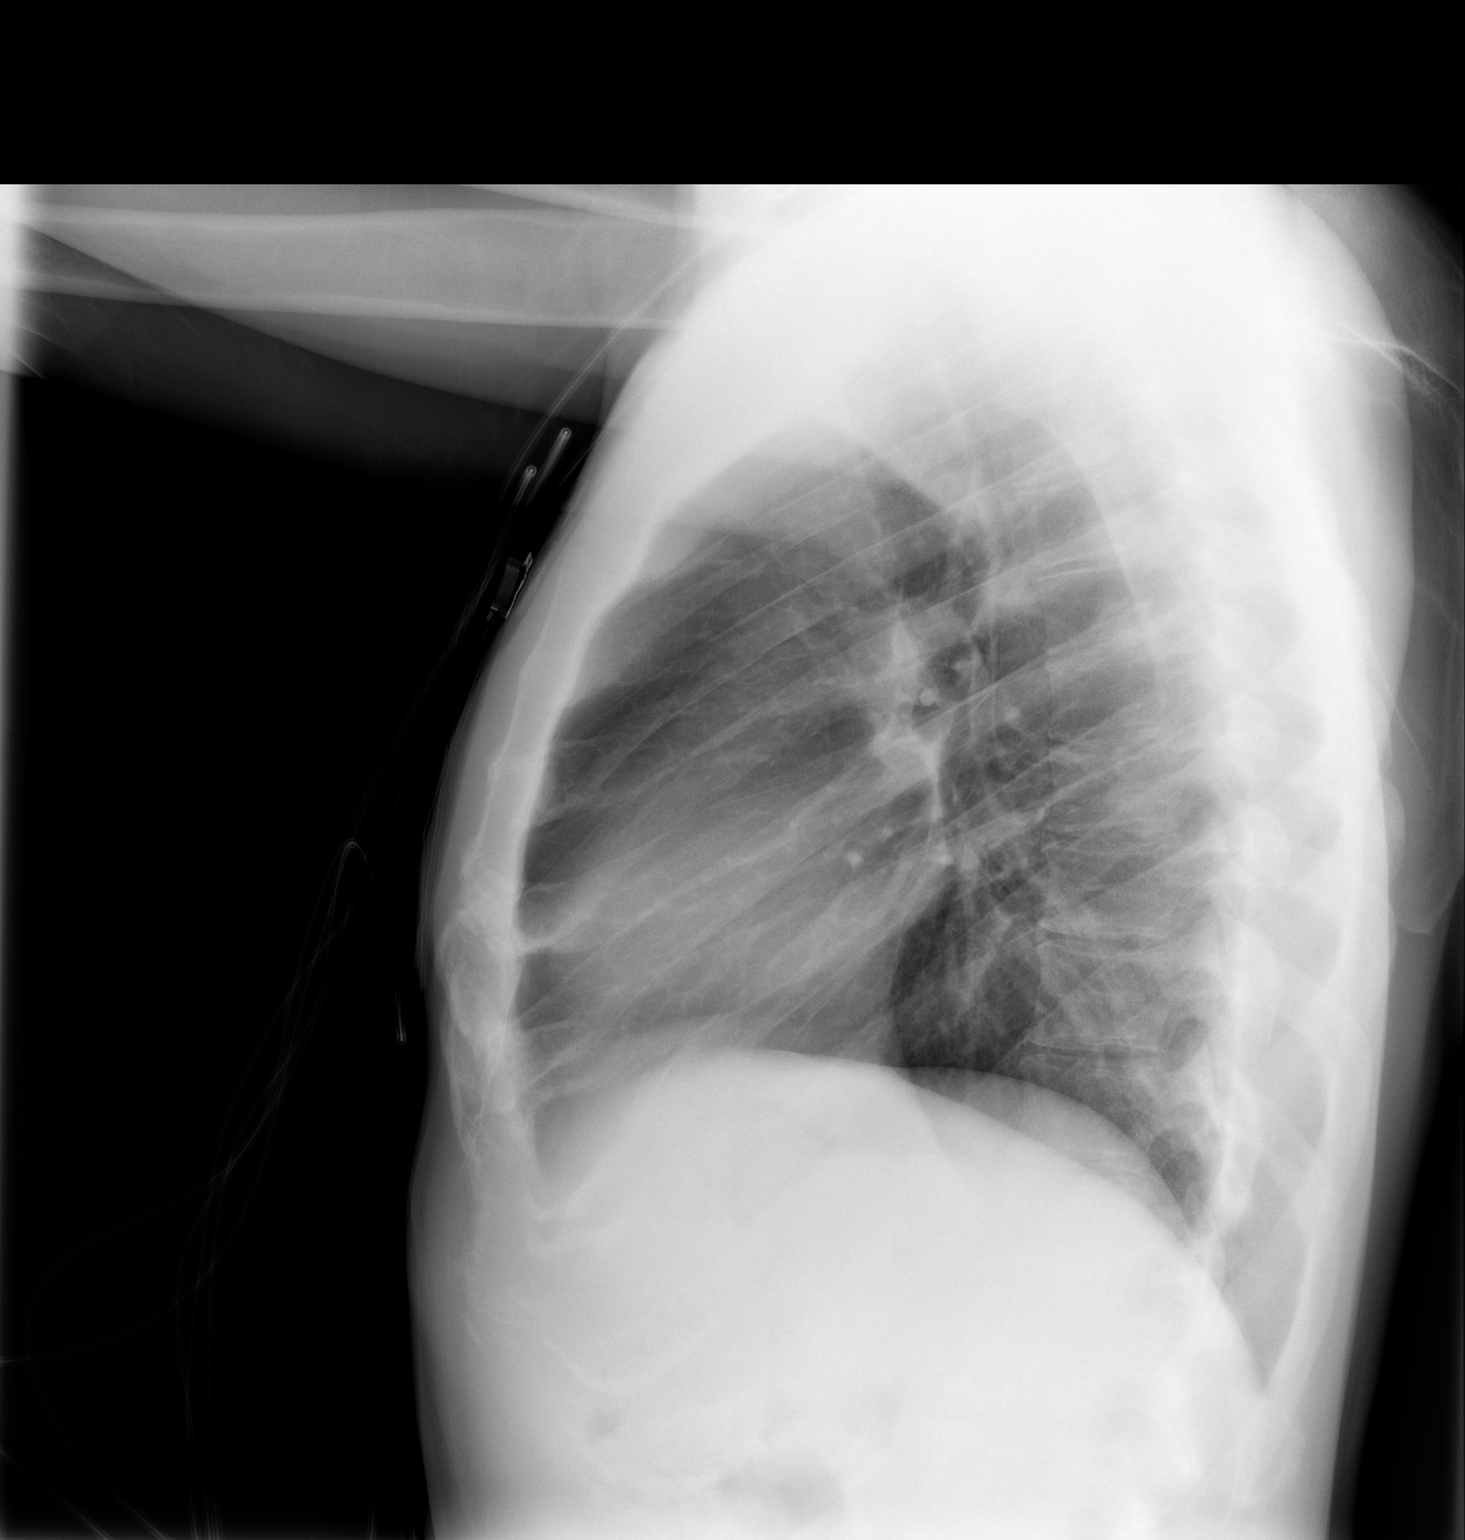

[2 of 2 positions shown; findings below may reference images not displayed]

FINDINGS: The heart size and mediastinal contours are within
normal limits.  Both lungs are clear.  The visualized skeletal
structures are unremarkable.
IMPRESSION: No active cardiopulmonary disease.

## 2014-06-06 IMAGING — CR DG KNEE 1-2V*R*
2 series · 2 of 2 positions shown · non-contrast
Comparison: 06/24/2011

CLINICAL DATA: Fall with right knee pain.

RIGHT KNEE - 1-2 VIEW

[t knee ap right]
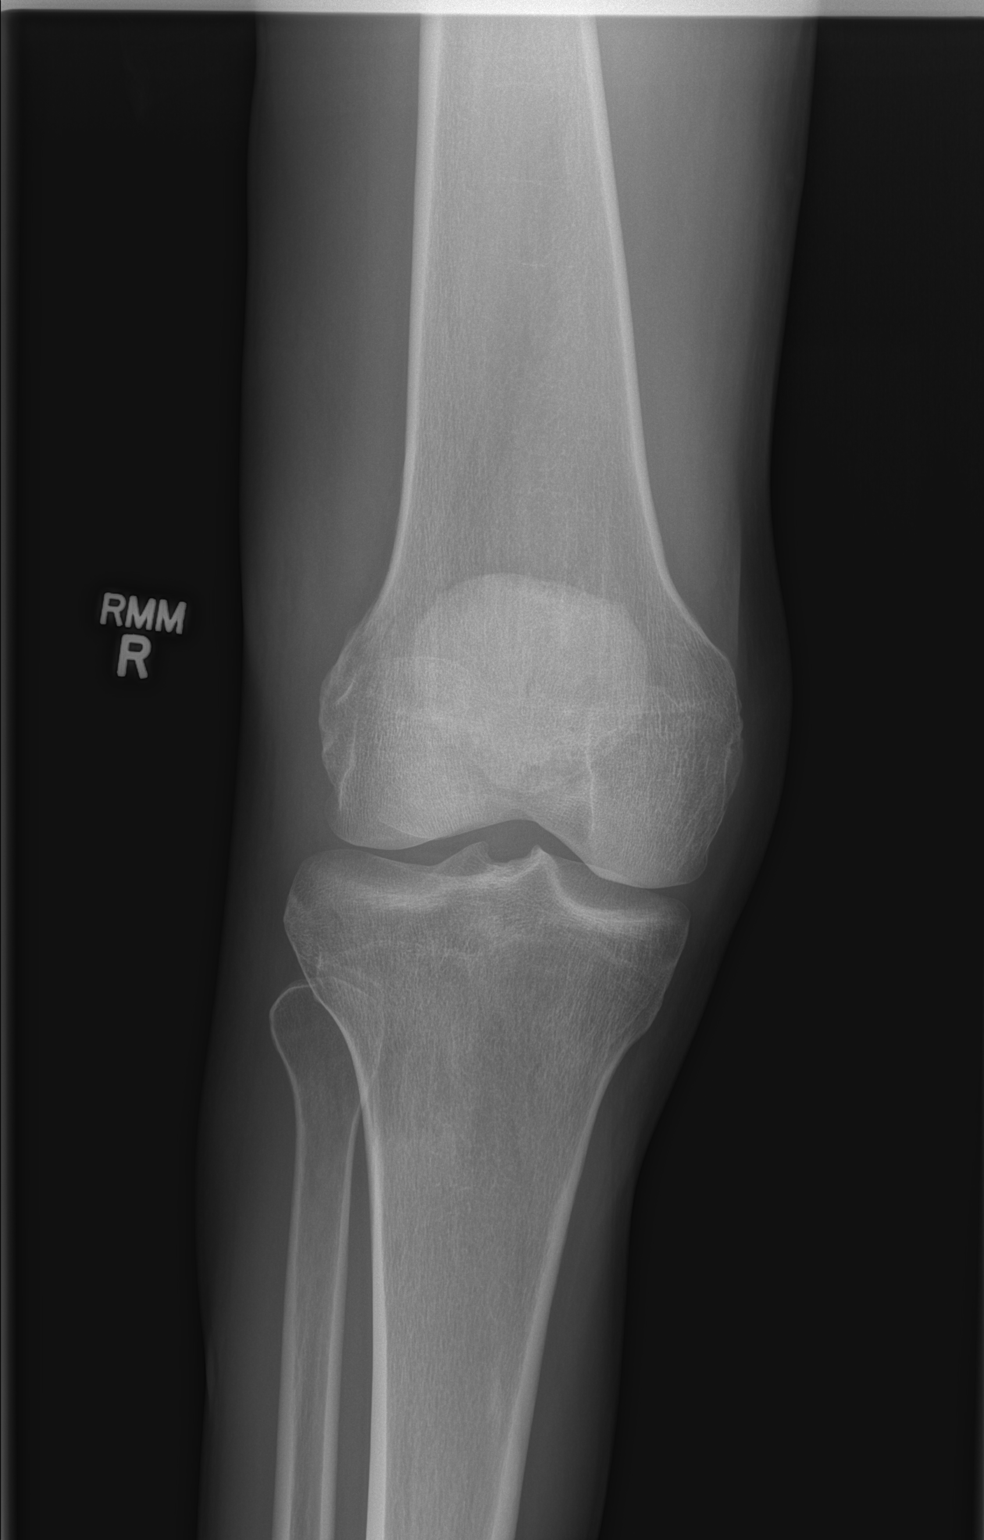

[t knee lat right]
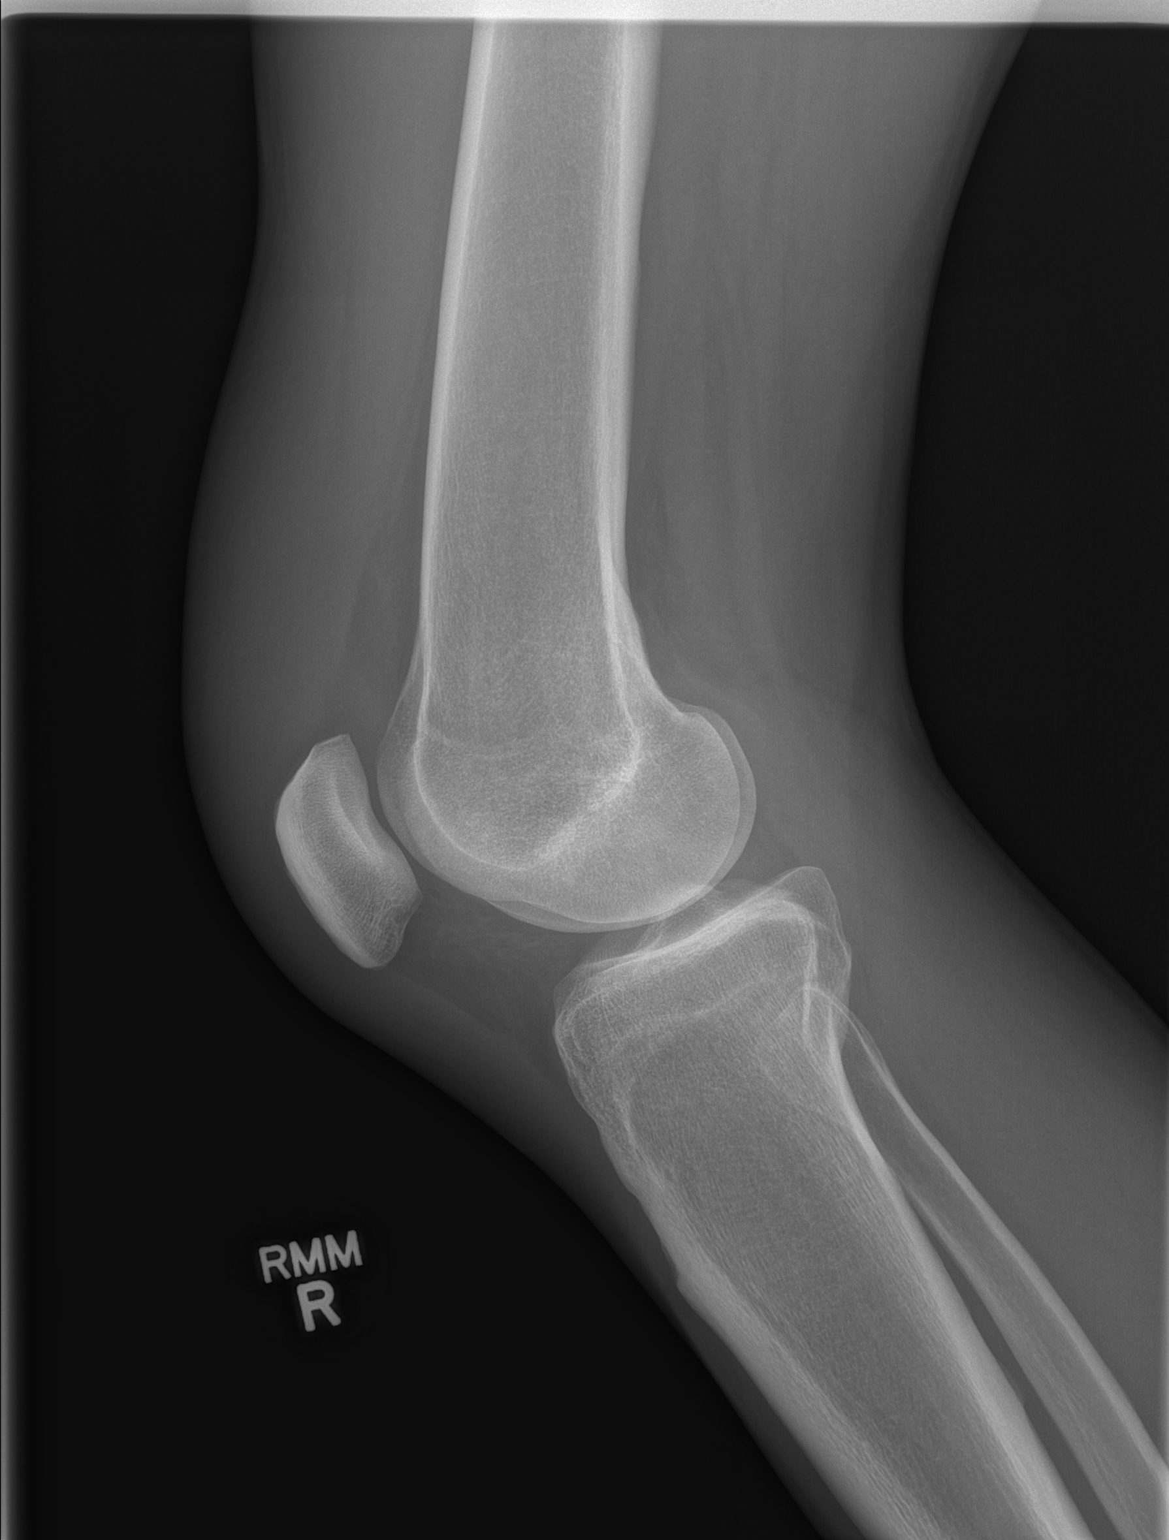

[2 of 2 positions shown; findings below may reference images not displayed]

FINDINGS: No evidence of acute fracture, subluxation or dislocation
identified.

A very small knee effusion is present.

No radio-opaque foreign bodies are present.

No focal bony lesions are noted.

The joint spaces are unremarkable.
IMPRESSION: Very small knee effusion without acute bony abnormality.

## 2019-03-17 ENCOUNTER — Other Ambulatory Visit: Payer: Self-pay

## 2019-03-17 ENCOUNTER — Ambulatory Visit: Payer: Managed Care, Other (non HMO) | Attending: Internal Medicine

## 2019-03-17 DIAGNOSIS — Z23 Encounter for immunization: Secondary | ICD-10-CM

## 2019-03-17 NOTE — Progress Notes (Signed)
   Covid-19 Vaccination Clinic  Name:  Frederick Maddox    MRN: [id number] DOB: 1965/06/25  03/17/2019  [name] was observed post Covid-19 immunization for 15 minutes without incident. He was provided with Vaccine Information Sheet and instruction to access the V-Safe system.   [name] was instructed to call 911 with any severe reactions post vaccine: Marland Kitchen Difficulty breathing  . Swelling of face and throat  . A fast heartbeat  . A bad rash all over body  . Dizziness and weakness   Immunizations Administered    Name Date Dose VIS Date Route   Moderna COVID-19 Vaccine 03/17/2019 11:46 AM 0.5 mL 12/05/2018 Intramuscular   Manufacturer: Moderna   Lot: [id number]   NDC: 30865-784-69

## 2019-04-18 ENCOUNTER — Ambulatory Visit: Payer: Managed Care, Other (non HMO) | Attending: Internal Medicine

## 2019-04-18 DIAGNOSIS — Z23 Encounter for immunization: Secondary | ICD-10-CM

## 2019-04-18 NOTE — Progress Notes (Signed)
   Covid-19 Vaccination Clinic  Name:  Frederick Maddox    MRN: [id number] DOB: 06-Feb-1965  04/18/2019  Mr. Frederick Maddox was observed post Covid-19 immunization for 15 minutes without incident. He was provided with Vaccine Information Sheet and instruction to access the V-Safe system.   Mr. Frederick Maddox was instructed to call 911 with any severe reactions post vaccine: Marland Kitchen Difficulty breathing  . Swelling of face and throat  . A fast heartbeat  . A bad rash all over body  . Dizziness and weakness   Immunizations Administered    Name Date Dose VIS Date Route   Moderna COVID-19 Vaccine 04/18/2019  9:28 AM 0.5 mL 12/05/2018 Intramuscular   Manufacturer: Moderna   Lot: [id number]   NDC: 02984-730-85

## 2022-03-29 ENCOUNTER — Ambulatory Visit
Admission: EM | Admit: 2022-03-29 | Discharge: 2022-03-29 | Disposition: A | Discharge status: Home / Self Care | Payer: Managed Care, Other (non HMO)

## 2022-03-29 DIAGNOSIS — J189 Pneumonia, unspecified organism: Secondary | ICD-10-CM | POA: Diagnosis not present

## 2022-03-29 MED ORDER — GUAIFENESIN 400 MG PO TABS: ORAL_TABLET | ORAL | 0 refills | Status: DC

## 2022-03-29 MED ORDER — IPRATROPIUM BROMIDE 0.06 % NA SOLN: 2.0000 | Freq: Three times a day (TID) | NASAL | 1 refills | Status: DC

## 2022-03-29 MED ORDER — PROMETHAZINE-DM 6.25-15 MG/5ML PO SYRP: 5.0000 mL | ORAL_SOLUTION | Freq: Every evening | ORAL | 0 refills | Status: DC | PRN

## 2022-03-29 MED ORDER — LEVOFLOXACIN 750 MG PO TABS: 750.0000 mg | ORAL_TABLET | Freq: Every day | ORAL | 0 refills | Status: AC

## 2022-03-29 NOTE — Discharge Instructions (Signed)
Based on results of your CT scan and my physical exam findings, I believe that you have pneumonia in your left upper lobe and recommend that you begin a more appropriate antibiotic for pneumonia treatment now.  Please read below to learn more about the medications, dosages and frequencies that I recommend to help alleviate your symptoms and to get you feeling better soon:   Levofloxacin: Please take one (1) dose daily for 5 days.  This antibiotic can cause upset stomach, this will resolve once antibiotics are complete.  You are welcome to take a probiotic, eat yogurt, take Imodium while taking this medication.  Please avoid other systemic medications such as Maalox, Pepto-Bismol or milk of magnesia as they can interfere with the body's ability to absorb the antibiotics.   Atrovent (ipratropium): This is an excellent nasal decongestant spray that does not cause rebound congestion.  Please instill 2 sprays into each nare with each use, you may use this up to 3 times daily.  Once you find that you are forgetting to use the spray more often that you remember to use it, you will know that you no longer need it.   Robitussin, Mucinex (guaifenesin): This is an expectorant.  This helps break up chest congestion and loosen up thick nasal drainage making phlegm and drainage more liquid and therefore easier to remove.  I recommend being 400 mg three times daily as needed.      Promethazine DM: Promethazine is both a nasal decongestant and an antinausea medication that makes most patients feel fairly sleepy.  The DM is dextromethorphan, a cough suppressant found in many over-the-counter cough medications.  Please take 5 mL before bedtime to minimize your cough which will help you sleep better.  I have sent a prescription for this medication to your pharmacy.   Please follow-up with either your primary care provider or with urgent care for repeat evaluation you of your lungs in the next 3 to 4 days to ensure that you  are improving and also to evaluate whether or not your treatment regimen needs to be adjusted.        Thank you for visiting urgent care today.  We appreciate the opportunity to participate in your care.

## 2022-03-29 NOTE — ED Provider Notes (Signed)
[name]    CSN: [id number] Arrival date & time: 03/29/22  1654    HISTORY   Chief Complaint  Patient presents with   Cough   HPI Frederick Maddox is a pleasant, 57 y.o. male who presents to urgent care today. Lanes of runny nose and cough for the past 10 days.  Patient states cough has been productive of dark-colored sputum.  Patient states that he finished a 5-day course of azithromycin about 5 days ago.  Patient states he has been taking Tylenol for headache and Mucinex without meaningful relief.  Patient denies fever, body aches, chills, nausea, vomiting, diarrhea, known sick contacts.  Patient states he felt well enough to go to work today but decided to come in to be evaluated given the amount of sputum he is still coughing up.  EMR reviewed.  Patient has a history of mucoid dermoid carcinoma, underwent CT scan of his chest 2 days ago for the purpose of ensuring that the carcinoma had not spread to his lungs.  Per CT scan, patient has groundglass appearance in left upper lobe, no evidence of spread of the carcinoma.  The history is provided by the patient.   Past Medical History:  Diagnosis Date   Adrenal insufficiency [facility])    Colonic polyp August 2013   Two removed, pathology pending   Depression    Diabetes mellitus    GERD (gastroesophageal reflux disease)    H/O hypotestosteronemia    Hiatal hernia    Tobacco use    Patient Active Problem List   Diagnosis Date Noted   Adrenal insufficiency ([place]) 08/24/2011   Tobacco abuse 08/24/2011   GI bleed 08/23/2011   Orthostatic hypotension 08/22/2011   Frequent loose stools 08/22/2011   B12 deficiency 08/08/2011   Iron deficiency anemia 08/07/2011   Weight loss 08/06/2011   Dizziness 08/06/2011   Diabetes mellitus ([place]) 08/06/2011   Depression 08/06/2011   Past Surgical History:  Procedure Laterality Date   COLONOSCOPY  08/23/2011   Procedure: COLONOSCOPY;  Surgeon: Cleotis Nipper, MD;  Location[facility]  ENDOSCOPY;  Service: Endoscopy;  Laterality: N/A;   ESOPHAGOGASTRODUODENOSCOPY  08/23/2011   Procedure: ESOPHAGOGASTRODUODENOSCOPY (EGD);  Surgeon: Cleotis Nipper, MD;  Location: [facility] ENDOSCOPY;  Service: Endoscopy;  Laterality: N/A;  Rm. [id number] ( formerly [id number])   EUS  09/08/2011   Procedure: FULL UPPER ENDOSCOPIC ULTRASOUND (EUS) RADIAL;  Surgeon: Arta Silence, MD;  Location: [facility] ENDOSCOPY;  Service: Endoscopy;  Laterality: N/A;  radia,l possible linea,r possible FNA   POLYPECTOMY      Home Medications    Prior to Admission medications   Medication Sig Start Date End Date Taking? Authorizing Provider  acetaminophen (TYLENOL) 500 MG tablet Take 1,000 mg by mouth every 6 (six) hours as needed. pain    [provider]  ferrous sulfate 325 (65 FE) MG tablet 325 mg. Take 325 mg by mouth daily with breakfast.    [provider]  fludrocortisone (FLORINEF) 0.1 MG tablet Take 2 tablets every day 11/27/12   Elayne Snare, MD  HYDROcodone-acetaminophen (VICODIN) 5-500 MG per tablet Take 1 tablet by mouth every 6 (six) hours as needed. pain    [provider]  hydrocortisone (CORTEF) 20 MG tablet 20 mg. Take 1 1/2 tablet by mouth every morning and 1/2 tablet at 5 pm. 03/16/12   [provider]  Multiple Vitamin (MULTIVITAMIN WITH MINERALS) TABS Take 1 tablet by mouth daily.    [provider]  omeprazole (PRILOSEC) 20  MG capsule Take 20 mg by mouth daily. 08/08/11 08/07/12  Johnson, Clanford L, MD  potassium chloride SA (K-DUR,KLOR-CON) 20 MEQ tablet Take 1 tablet (20 mEq total) by mouth daily. 07/05/12   Elayne Snare, MD  potassium chloride SA (K-DUR,KLOR-CON) 20 MEQ tablet Take by mouth. 07/05/12   [provider]  Propylene Glycol (SYSTANE BALANCE) 0.6 % SOLN 1 drop. 1 drop 2 times daily.    [provider]  Vilazodone HCl (VIIBRYD) 40 MG TABS Take 40 mg by mouth daily.    [provider]  Vitamins/Minerals TABS Take by mouth.    [provider]    Family History Family History  Problem Relation Age of Onset   Diabetes type II Mother        many family members   Hypertension Brother        many family members   Breast cancer Maternal Grandmother    Breast cancer Cousin    Brain cancer Maternal Grandmother    Social History Social History   Tobacco Use   Smoking status: Former    Packs/day: 0.50    Years: 15.00    Additional pack years: 0.00    Total pack years: 7.50    Types: Cigarettes    Quit date: 08/22/2011    Years since quitting: 10.6  Substance Use Topics   Alcohol use: Yes    Alcohol/week: 1.0 standard drink of alcohol    Types: 1 drink(s) per week    Comment: rarely   Drug use: No   Allergies   Patient has no known allergies.  Review of Systems Review of Systems Pertinent findings revealed after performing a 14 point review of systems has been noted in the history of present illness.  Physical Exam Vital Signs BP (!) 89/66 (BP Location: Right Arm)   Pulse 80   Temp 97.8 F (36.6 C) (Oral)   Resp 18   SpO2 98%   No data found.  Physical Exam Vitals and nursing note reviewed.  Constitutional:      General: He is awake. He is not in acute distress.    Appearance: Normal appearance. He is well-developed and well-groomed. He is ill-appearing.  HENT:     Head: Normocephalic and atraumatic.     Salivary Glands: Right salivary gland is not diffusely enlarged or tender. Left salivary gland is not diffusely enlarged or tender.     Right Ear: Hearing, tympanic membrane, ear canal and external ear normal. No drainage. No middle ear effusion. There is no impacted cerumen. Tympanic membrane is not erythematous or bulging.     Left Ear: Hearing, tympanic membrane, ear canal and external ear normal. No drainage.  No middle ear effusion. There is no impacted cerumen. Tympanic membrane is not erythematous or bulging.     Nose: Rhinorrhea present. No nasal deformity, septal deviation, mucosal  edema or congestion. Rhinorrhea is clear.     Right Turbinates: Not enlarged, swollen or pale.     Left Turbinates: Not enlarged, swollen or pale.     Right Sinus: No maxillary sinus tenderness or frontal sinus tenderness.     Left Sinus: No maxillary sinus tenderness or frontal sinus tenderness.     Mouth/Throat:     Lips: Pink. No lesions.     Mouth: Mucous membranes are moist. No oral lesions.     Tongue: No lesions. Tongue does not deviate from midline.     Palate: No mass and lesions.     Pharynx:  Oropharynx is clear. Uvula midline. No pharyngeal swelling, oropharyngeal exudate, posterior oropharyngeal erythema or uvula swelling.     Tonsils: No tonsillar exudate. 0 on the right. 0 on the left.  Eyes:     General: Lids are normal.        Right eye: No discharge.        Left eye: No discharge.     Extraocular Movements: Extraocular movements intact.     Conjunctiva/sclera: Conjunctivae normal.     Right eye: Right conjunctiva is not injected.     Left eye: Left conjunctiva is not injected.     Pupils: Pupils are equal, round, and reactive to light.  Neck:     Trachea: Trachea and phonation normal.  Cardiovascular:     Rate and Rhythm: Normal rate and regular rhythm.     Pulses: Normal pulses.     Heart sounds: Normal heart sounds, S1 normal and S2 normal. No murmur heard.    No friction rub. No gallop.  Pulmonary:     Effort: Pulmonary effort is normal. No tachypnea, bradypnea, accessory muscle usage, prolonged expiration, respiratory distress or retractions.     Breath sounds: No stridor, decreased air movement or transmitted upper airway sounds. Examination of the left-upper field reveals rales. Rales present. No decreased breath sounds, wheezing or rhonchi.  Chest:     Chest wall: No tenderness.  Musculoskeletal:        General: Normal range of motion.     Cervical back: Full passive range of motion without pain, normal range of motion and neck supple. Normal range of  motion.  Lymphadenopathy:     Cervical: No cervical adenopathy.  Skin:    General: Skin is warm and dry.     Findings: No erythema or rash.  Neurological:     General: No focal deficit present.     Mental Status: He is alert and oriented to person, place, and time.  Psychiatric:        Mood and Affect: Mood normal.        Behavior: Behavior normal. Behavior is cooperative.     Visual Acuity Right Eye Distance:   Left Eye Distance:   Bilateral Distance:    Right Eye Near:   Left Eye Near:    Bilateral Near:     UC Couse / Diagnostics / Procedures:     Radiology No results found.  Procedures Procedures (including critical care time) EKG  Pending results:  Labs Reviewed - No data to display  Medications Ordered in UC: Medications - No data to display  UC Diagnoses / Final Clinical Impressions(s)   I have reviewed the triage vital signs and the nursing notes.  Pertinent labs & imaging results that were available during my care of the patient were reviewed by me and considered in my medical decision making (see chart for details).    Final diagnoses:  Pneumonia of left upper lobe due to infectious organism   Based on results of CT scan, inadequate treatment with azithromycin and my physical exam findings, believe patient should be treated empirically for presumed bacterial pneumonia and left upper lobe.  Patient provided with a 5-day course of levofloxacin and advised to continue Mucinex.  Patient provided with Atrovent nasal spray to dry up runny nose and Promethazine DM for nighttime cough.  Conservative care recommended.  Return precautions advised. Please see discharge instructions below for further details of plan of care as provided to patient. ED Prescriptions  Medication Sig Dispense Auth. Provider   levofloxacin (LEVAQUIN) 750 MG tablet Take 1 tablet (750 mg total) by mouth daily for 5 days. 5 tablet Lynden Oxford Scales, PA-C   guaifenesin (HUMIBID E)  400 MG TABS tablet Take 1 tablet 3 times daily as needed for chest congestion and cough 30 tablet Lynden Oxford Scales, PA-C   promethazine-dextromethorphan (PROMETHAZINE-DM) 6.25-15 MG/5ML syrup Take 5 mLs by mouth at bedtime as needed for cough. 60 mL Lynden Oxford Scales, PA-C   ipratropium (ATROVENT) 0.06 % nasal spray Place 2 sprays into both nostrils 3 (three) times daily. As needed for nasal congestion, runny nose 15 mL Lynden Oxford Scales, PA-C      PDMP not reviewed this encounter.  Disposition Upon Discharge:  Condition: stable for discharge home Home: take medications as prescribed; routine discharge instructions as discussed; follow up as advised.  Patient presented with an acute illness with associated systemic symptoms and significant discomfort requiring urgent management. In my opinion, this is a condition that a prudent lay person (someone who possesses an average knowledge of health and medicine) may potentially expect to result in complications if not addressed urgently such as respiratory distress, impairment of bodily function or dysfunction of bodily organs.   Routine symptom specific, illness specific and/or disease specific instructions were discussed with the patient and/or caregiver at length.   As such, the patient has been evaluated and assessed, work-up was performed and treatment was provided in alignment with urgent care protocols and evidence based medicine.  Patient/parent/caregiver has been advised that the patient may require follow up for further testing and treatment if the symptoms continue in spite of treatment, as clinically indicated and appropriate.  If the patient was tested for COVID-19, Influenza and/or RSV, then the patient/parent/guardian was advised to isolate at home pending the results of his/her diagnostic coronavirus test and potentially longer if they're positive. I have also advised pt that if his/her COVID-19 test returns positive, it's  recommended to self-isolate for at least 10 days after symptoms first appeared AND until fever-free for 24 hours without fever reducer AND other symptoms have improved or resolved. Discussed self-isolation recommendations as well as instructions for household member/close contacts as per the [facility] and [place] [facility], and also gave patient the [place] packet with this information.  Patient/parent/caregiver has been advised to return to the [facility] or PCP in 3-5 days if no better; to PCP or the Emergency Department if new signs and symptoms develop, or if the current signs or symptoms continue to change or worsen for further workup, evaluation and treatment as clinically indicated and appropriate  The patient will follow up with their current PCP if and as advised. If the patient does not currently have a PCP we will assist them in obtaining one.   The patient may need specialty follow up if the symptoms continue, in spite of conservative treatment and management, for further workup, evaluation, consultation and treatment as clinically indicated and appropriate.  Patient/parent/caregiver verbalized understanding and agreement of plan as discussed.  All questions were addressed during visit.  Please see discharge instructions below for further details of plan.  Discharge Instructions:   Discharge Instructions      Based on results of your CT scan and my physical exam findings, I believe that you have pneumonia in your left upper lobe and recommend that you begin a more appropriate antibiotic for pneumonia treatment now.  Please read below to learn more about the medications, dosages and frequencies that I recommend  to help alleviate your symptoms and to get you feeling better soon:   Levofloxacin: Please take one (1) dose daily for 5 days.  This antibiotic can cause upset stomach, this will resolve once antibiotics are complete.  You are welcome to take a probiotic, eat yogurt, take Imodium while taking this  medication.  Please avoid other systemic medications such as Maalox, Pepto-Bismol or milk of magnesia as they can interfere with the body's ability to absorb the antibiotics.   Atrovent (ipratropium): This is an excellent nasal decongestant spray that does not cause rebound congestion.  Please instill 2 sprays into each nare with each use, you may use this up to 3 times daily.  Once you find that you are forgetting to use the spray more often that you remember to use it, you will know that you no longer need it.   Robitussin, Mucinex (guaifenesin): This is an expectorant.  This helps break up chest congestion and loosen up thick nasal drainage making phlegm and drainage more liquid and therefore easier to remove.  I recommend being 400 mg three times daily as needed.      Promethazine DM: Promethazine is both a nasal decongestant and an antinausea medication that makes most patients feel fairly sleepy.  The DM is dextromethorphan, a cough suppressant found in many over-the-counter cough medications.  Please take 5 mL before bedtime to minimize your cough which will help you sleep better.  I have sent a prescription for this medication to your pharmacy.   Please follow-up with either your primary care provider or with urgent care for repeat evaluation you of your lungs in the next 3 to 4 days to ensure that you are improving and also to evaluate whether or not your treatment regimen needs to be adjusted.        Thank you for visiting urgent care today.  We appreciate the opportunity to participate in your care.       This office note has been dictated using Museum/gallery curator.  Unfortunately, this method of dictation can sometimes lead to typographical or grammatical errors.  I apologize for your inconvenience in advance if this occurs.  Please do not hesitate to reach out to me if clarification is needed.      Lynden Oxford Scales, [place] 03/29/22 [phone]

## 2022-03-29 NOTE — ED Triage Notes (Signed)
Completed Z-pack last week. C/o continued frequent productive cough for ten days. Home remedies: Tylenol for HA and Mucinex. Denies: F, chills.

## 2022-03-30 ENCOUNTER — Telehealth: Payer: Self-pay | Admitting: Emergency Medicine

## 2022-03-30 MED ORDER — GUAIFENESIN 400 MG PO TABS: ORAL_TABLET | ORAL | 0 refills | Status: DC

## 2022-03-30 MED ORDER — PROMETHAZINE-DM 6.25-15 MG/5ML PO SYRP: 5.0000 mL | ORAL_SOLUTION | Freq: Every evening | ORAL | 0 refills | Status: DC | PRN

## 2022-03-30 NOTE — Telephone Encounter (Signed)
Pharmacy claims that prescription for Promethazine DM was not sent during patient's visit yesterday.  Per EMR, it was sent and verified as received.  Promethazine DM prescription was sent again today.

## 2022-03-30 NOTE — Telephone Encounter (Signed)
Patient stating that pharmacy did not give him prescription for guaifenesin.  Prescription resent.

## 2022-05-21 ENCOUNTER — Ambulatory Visit
Admission: EM | Admit: 2022-05-21 | Discharge: 2022-05-21 | Disposition: A | Discharge status: Home / Self Care | Payer: Managed Care, Other (non HMO) | Attending: Internal Medicine | Admitting: Internal Medicine

## 2022-05-21 ENCOUNTER — Ambulatory Visit: Payer: Managed Care, Other (non HMO)

## 2022-05-21 DIAGNOSIS — Z992 Dependence on renal dialysis: Secondary | ICD-10-CM

## 2022-05-21 DIAGNOSIS — M542 Cervicalgia: Secondary | ICD-10-CM

## 2022-05-21 DIAGNOSIS — N186 End stage renal disease: Secondary | ICD-10-CM

## 2022-05-21 DIAGNOSIS — M79622 Pain in left upper arm: Secondary | ICD-10-CM

## 2022-05-21 NOTE — Discharge Instructions (Addendum)
Your x-rays showed no broken bones or dislocations. They were sent to a radiologist for further evaluation and we will call you if the radiologist sees anything different.    Recommend rest, ice, compression, and elevation. You can take Tylenol as needed for pain.   Return in 2 to 3 days if no improvement. Your evaluation was not suggestive of any emergent condition requiring medical intervention at this time. However, our office is limited in the tests and imaging we can perform at our facility.  Therefore, it is very important for you to pay attention to any new symptoms or worsening of your current condition.   Please go directly to the Emergency Department immediately should you begin to feel worse in any way or have any of the following symptoms: increased pain, headache, bowel/urinary incontinence, numbness between your legs, fever new onset numbness/tingling.
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
## 2022-05-28 ENCOUNTER — Ambulatory Visit
Admission: EM | Admit: 2022-05-28 | Discharge: 2022-05-28 | Disposition: A | Discharge status: Home / Self Care | Payer: Managed Care, Other (non HMO) | Attending: Emergency Medicine | Admitting: Emergency Medicine

## 2022-05-28 DIAGNOSIS — M62838 Other muscle spasm: Secondary | ICD-10-CM | POA: Diagnosis not present

## 2022-05-28 MED ORDER — BACLOFEN 10 MG PO TABS: 10.0000 mg | ORAL_TABLET | Freq: Three times a day (TID) | ORAL | 0 refills | Status: AC

## 2022-05-28 MED ORDER — IBUPROFEN 600 MG PO TABS: 600.0000 mg | ORAL_TABLET | Freq: Three times a day (TID) | ORAL | 0 refills | Status: DC | PRN

## 2022-05-28 MED ORDER — ACETAMINOPHEN 500 MG PO TABS: 1000.0000 mg | ORAL_TABLET | Freq: Three times a day (TID) | ORAL | 0 refills | Status: DC | PRN
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
## 2022-05-28 NOTE — Discharge Instructions (Addendum)
The mainstay of therapy for musculoskeletal pain is reduction of inflammation and relaxation of tension which is causing inflammation.  Keep in mind, pain always begets more pain.  To help you stay ahead of your pain and inflammation, I have provided the following regimen for you:   When you pick up your prescription from the pharmacy, please begin taking Tylenol 975 mg 3 times daily (every 8 hours).  Please know that It is safe to take a maximum 3000 mg of Tylenol in a 24-hour period.  Please do not exceed this amount.  Tylenol works best when taken on a scheduled basis.   When you pick up your prescription from the pharmacy, you can begin taking baclofen 10 mg.  This is a highly effective muscle relaxer and antispasmodic which should continue to provide you with relaxation of your tense muscles, allow you to sleep well and to keep your pain under control.  You can continue taking this medication 3 times daily as you need to.  If you find that this medication makes you too sleepy, you can break them in half for your daytime doses and, if needed double them for your nighttime dose.  Do not take more than 30 mg of baclofen in a 24-hour period.   When you pick up your prescription from the pharmacy, please begin taking ibuprofen 800 mg 3 times daily.  Please keep in mind that it is always easier to treat a little bit of pain that is to treat a lot of pain.  I recommend that for the next several days, you take this medication on a scheduled basis.  After that, take it when you begin to feel the pain returning, do not wait until you are in a lot of pain.   I have enclosed a picture of myofascial release balls that can be purchased on [email or web address].     Please visit this website for tips and tricks on how to use this myofacial release technique to relieve muscle spasm and back pain: [email or web address]/self-myofascial-release-techniques-using-massage-balls   Please consider discussing referral to  a chiropractor or to physical therapy with your primary care provider.  Chiropractors and physical therapists are very good at teasing out the underlying cause of acute musculoskeletal pain and helping with prevention of future recurrences.   Please avoid attempts to stretch or strengthen the affected area until you are feeling completely pain-free.  Attempts to do so will only prolong the healing process.   I also recommend that you remain out of work for the next several days, I provided you with a note to return to work in 3 days.  If you feel that you need this time extended, please follow-up with your primary care provider or return to urgent care for reevaluation so that we can provide you with a note for another 3 days.   Thank you for visiting [facility] today.  We appreciate the opportunity to participate in your care.
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
## 2022-11-10 ENCOUNTER — Ambulatory Visit
Admission: EM | Admit: 2022-11-10 | Discharge: 2022-11-10 | Disposition: A | Discharge status: Home / Self Care | Payer: Managed Care, Other (non HMO) | Attending: Internal Medicine | Admitting: Internal Medicine

## 2022-11-10 DIAGNOSIS — Z1152 Encounter for screening for COVID-19: Secondary | ICD-10-CM | POA: Insufficient documentation

## 2022-11-10 DIAGNOSIS — Z992 Dependence on renal dialysis: Secondary | ICD-10-CM | POA: Insufficient documentation

## 2022-11-10 DIAGNOSIS — Z9484 Stem cells transplant status: Secondary | ICD-10-CM | POA: Diagnosis not present

## 2022-11-10 DIAGNOSIS — J209 Acute bronchitis, unspecified: Secondary | ICD-10-CM | POA: Diagnosis not present

## 2022-11-10 DIAGNOSIS — R0981 Nasal congestion: Secondary | ICD-10-CM | POA: Diagnosis not present

## 2022-11-10 DIAGNOSIS — D849 Immunodeficiency, unspecified: Secondary | ICD-10-CM | POA: Diagnosis not present

## 2022-11-10 DIAGNOSIS — Z85818 Personal history of malignant neoplasm of other sites of lip, oral cavity, and pharynx: Secondary | ICD-10-CM | POA: Insufficient documentation

## 2022-11-10 DIAGNOSIS — E859 Amyloidosis, unspecified: Secondary | ICD-10-CM | POA: Insufficient documentation

## 2022-11-10 DIAGNOSIS — R059 Cough, unspecified: Secondary | ICD-10-CM | POA: Diagnosis present

## 2022-11-10 DIAGNOSIS — N186 End stage renal disease: Secondary | ICD-10-CM | POA: Insufficient documentation

## 2022-11-10 DIAGNOSIS — Z87891 Personal history of nicotine dependence: Secondary | ICD-10-CM | POA: Insufficient documentation

## 2022-11-10 LAB — POCT INFLUENZA A/B
Influenza A, POC: NEGATIVE
Influenza B, POC: NEGATIVE

## 2022-11-10 MED ORDER — BENZONATATE 200 MG PO CAPS: 200.0000 mg | ORAL_CAPSULE | Freq: Three times a day (TID) | ORAL | 0 refills | Status: DC | PRN

## 2022-11-10 MED ORDER — DOXYCYCLINE HYCLATE 100 MG PO CAPS: 100.0000 mg | ORAL_CAPSULE | Freq: Two times a day (BID) | ORAL | 0 refills | Status: DC

## 2022-11-10 NOTE — Discharge Instructions (Signed)
Your flu test was negative and your COVID test has been sent to the lab. Someone from our office will call you with your results. A prescription was sent for Doxycycline. This is an antibiotic used to treat upper respiratory infections. Take as directed. I have also sent a prescription for cough medicine to your pharmacy.  Return in 3 to 4 days if no improvement. It is very important for you to pay attention to any new symptoms or worsening of your current condition. Please go directly to the Emergency Department immediately should you begin to have any of the following symptoms: shortness of breath, chest pain or difficulty breathing.

## 2022-11-10 NOTE — ED Provider Notes (Signed)
[facility]  Note:  This document was prepared using Dragon voice recognition software and may include unintentional dictation errors.  MRN: [id number] DOB: 08-27-1965 DATE: 11/10/22   Subjective:  Chief Complaint:  Chief Complaint  Patient presents with   Cough     HPI: Frederick Maddox is a 57 y.o. male presenting for productive cough and congestion for the past 3-4 days. Patient states that he went to [facility] at the end of October 2024 and was feeling unwell during that time, but symptoms improved until they returned earlier this week. He reports productive cough as well as nasal congestion and runny nose. He has take OTC Delsym with no relief. No known sick contacts. 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 Denies fever, nausea/vomiting, sore throat, otalgia. Endorses productive cough, congestion, rhinorrhea. Presents NAD.  Prior to Admission medications   Medication Sig Start Date End Date Taking? Authorizing Provider  calcitRIOL (ROCALTROL) 0.25 MCG capsule  01/01/22   [provider]  calcium acetate (PHOSLO) 667 MG capsule Take by mouth. 01/05/22   [provider]  carvedilol (COREG) 6.25 MG tablet Take by mouth. 10/28/21   [provider]  furosemide (LASIX) 80 MG tablet Take by mouth. 01/01/22   [provider]  Multiple Vitamin (MULTIVITAMIN WITH MINERALS) TABS Take 1 tablet by mouth daily.    [provider]  omeprazole (PRILOSEC) 20 MG capsule Take 20 mg by mouth daily. 08/08/11 08/07/12  Johnson, Clanford L, MD  pantoprazole (PROTONIX) 40 MG tablet Take by mouth. 12/04/21   [provider]  potassium chloride SA (K-DUR,KLOR-CON) 20 MEQ tablet Take 1 tablet (20 mEq total) by mouth daily. 07/05/12   Reather Littler, MD  Vitamins/Minerals TABS Take by mouth.    [provider]     No Known Allergies  History:   Past Medical History:   Diagnosis Date   Adrenal insufficiency (HCC)    Colonic polyp August 2013   Two removed, pathology pending   Depression    Diabetes mellitus    GERD (gastroesophageal reflux disease)    H/O hypotestosteronemia    Hiatal hernia    Tobacco use      Past Surgical History:  Procedure Laterality Date   COLONOSCOPY  08/23/2011   Procedure: COLONOSCOPY;  Surgeon: Florencia Reasons, MD;  Location: [facility] ENDOSCOPY;  Service: Endoscopy;  Laterality: N/A;   ESOPHAGOGASTRODUODENOSCOPY  08/23/2011   Procedure: ESOPHAGOGASTRODUODENOSCOPY (EGD);  Surgeon: Florencia Reasons, MD;  Location: [facility] ENDOSCOPY;  Service: Endoscopy;  Laterality: N/A;  Rm. [id number] ( formerly 3002)   EUS  09/08/2011   Procedure: FULL UPPER ENDOSCOPIC ULTRASOUND (EUS) RADIAL;  Surgeon: Willis Modena, MD;  Location: [facility] ENDOSCOPY;  Service: Endoscopy;  Laterality: N/A;  radia,l possible linea,r possible FNA   POLYPECTOMY      Family History  Problem Relation Age of Onset   Diabetes type II Mother        many family members   Hypertension Brother        many family members   Breast cancer Maternal Grandmother    Breast cancer Cousin    Brain cancer Maternal Grandmother     Social History   Tobacco Use   Smoking status: Former    Current packs/day: 0.00    Average packs/day: 0.5 packs/day for 15.0 years (7.5 ttl pk-yrs)    Types: Cigarettes    Start date: 08/21/1996    Quit  date: 08/22/2011    Years since quitting: 11.2  Substance Use Topics   Alcohol use: Yes    Alcohol/week: 1.0 standard drink of alcohol    Types: 1 drink(s) per week    Comment: rarely   Drug use: No    Review of Systems  Constitutional:  Negative for fever.  HENT:  Positive for congestion, postnasal drip, rhinorrhea and sinus pressure. Negative for ear pain and sore throat.   Respiratory:  Positive for cough.   Gastrointestinal:  Negative for nausea and vomiting.  Neurological:  Positive for headaches.     Objective:   Vitals: BP 123/84 (BP  Location: Right Arm)   Pulse 76   Temp 97.8 F (36.6 C) (Oral)   Resp 16   SpO2 97%   Physical Exam Constitutional:      General: He is not in acute distress.    Appearance: Normal appearance. He is well-developed and normal weight. He is not ill-appearing or toxic-appearing.  HENT:     Head: Normocephalic and atraumatic.     Right Ear: Tympanic membrane and ear canal normal.     Left Ear: Tympanic membrane and ear canal normal.     Mouth/Throat:     Pharynx: Oropharynx is clear. Uvula midline. No pharyngeal swelling, oropharyngeal exudate or posterior oropharyngeal erythema.     Tonsils: No tonsillar exudate or tonsillar abscesses.  Cardiovascular:     Rate and Rhythm: Normal rate and regular rhythm.     Heart sounds: Normal heart sounds.  Pulmonary:     Effort: Pulmonary effort is normal.     Breath sounds: Normal breath sounds.     Comments: Clear to auscultation bilaterally  Abdominal:     General: Bowel sounds are normal.     Palpations: Abdomen is soft.     Tenderness: There is no abdominal tenderness.  Skin:    General: Skin is warm and dry.  Neurological:     General: No focal deficit present.     Mental Status: He is alert.  Psychiatric:        Mood and Affect: Mood and affect normal.     Results:  Labs: Results for orders placed or performed during the hospital encounter of 11/10/22 (from the past 24 hour(s))  POCT Influenza A/B     Status: None   Collection Time: 11/10/22  6:32 PM  Result Value Ref Range   Influenza A, POC Negative Negative   Influenza B, POC Negative Negative    Radiology: No results found.   UC Course/Treatments:  Procedures: Procedures   Medications Ordered in UC: Medications - No data to display   Assessment and Plan :     ICD-10-CM   1. Acute bronchitis, unspecified organism  J20.9 SARS CORONAVIRUS 2 (TAT 6-24 HRS) Anterior Nasal Swab    SARS CORONAVIRUS 2 (TAT 6-24 HRS) Anterior Nasal Swab    2. Nasal congestion   R09.81      Acute bronchitis, unspecified organism Afebrile, nontoxic-appearing, NAD. VSS. DDX includes but not limited to: COVID, flu, bronchitis, pneumonia Flu was negative today in office. COVID is pending. Suspect viral URI versus secondary bacterial infection following viral URI at the end of October. Given patient's immunocompromised, Doxycycline 100mg  BID was prescribed to empirically cover bacterial bronchitis. Benzonatate 200mg  TID PRN prescribed for cough. Strict ED precautions were given and patient verbalized understanding.  Nasal Congestion Afebrile, nontoxic-appearing, NAD. VSS. DDX includes but not limited to: COVID, flu, viral URI, sinusitis, allergic rhinitis Flu was  negative today in office. COVID is pending. Suspect viral URI versus secondary bacterial infection following viral URI at the end of October. Given patient's immunocompromised, Doxycycline 100mg  BID was prescribed to empirically cover bacterial bronchitis. Benzonatate 200mg  TID PRN prescribed for cough. Strict ED precautions were given and patient verbalized understanding.  ED Discharge Orders          Ordered    doxycycline (VIBRAMYCIN) 100 MG capsule  2 times daily        11/10/22 1837    benzonatate (TESSALON) 200 MG capsule  3 times daily PRN        11/10/22 1837             PDMP not reviewed this encounter.     Cynda Acres, PA-C 11/10/22 1838

## 2022-11-10 NOTE — ED Triage Notes (Signed)
Cough sometimes productive, chest congestion, runny nose, slight HA, sneezing x 4 days. Denies: F, chills, body aches or sore throat Home remedies: Delsym (not helping

## 2022-11-11 LAB — SARS CORONAVIRUS 2 (TAT 6-24 HRS): SARS Coronavirus 2: NEGATIVE

## 2022-11-19 ENCOUNTER — Ambulatory Visit: Payer: Managed Care, Other (non HMO)

## 2022-11-19 ENCOUNTER — Ambulatory Visit
Admission: EM | Admit: 2022-11-19 | Discharge: 2022-11-19 | Disposition: A | Discharge status: Home / Self Care | Payer: Managed Care, Other (non HMO) | Attending: Internal Medicine | Admitting: Internal Medicine

## 2022-11-19 DIAGNOSIS — R059 Cough, unspecified: Secondary | ICD-10-CM

## 2022-11-19 DIAGNOSIS — J209 Acute bronchitis, unspecified: Secondary | ICD-10-CM | POA: Diagnosis not present

## 2022-11-19 DIAGNOSIS — R509 Fever, unspecified: Secondary | ICD-10-CM | POA: Diagnosis not present

## 2022-11-19 LAB — POC COVID19/FLU A&B COMBO
Covid Antigen, POC: NEGATIVE
Influenza A Antigen, POC: NEGATIVE
Influenza B Antigen, POC: NEGATIVE

## 2022-11-19 MED ORDER — ALBUTEROL SULFATE (2.5 MG/3ML) 0.083% IN NEBU
2.5000 mg | INHALATION_SOLUTION | Freq: Once | RESPIRATORY_TRACT | Status: AC
  Administered 2022-11-19: 2.5 mg via RESPIRATORY_TRACT

## 2022-11-19 MED ORDER — AZITHROMYCIN 250 MG PO TABS: ORAL_TABLET | ORAL | 0 refills | Status: DC

## 2022-11-19 NOTE — Discharge Instructions (Signed)
Your flu test was negative and your COVID test has been sent to the lab. Someone from our office will call you with your results. A prescription was sent for Zithromax. This is an antibiotic used to treat upper respiratory infections. Take as directed.   I recommend you follow up with your PCP next week for recheck. It is very important for you to pay attention to any new symptoms or worsening of your current condition.  Please go directly to the Emergency Department immediately should you begin to have any of the following symptoms: shortness of breath, chest pain or difficulty breathing.

## 2022-11-19 NOTE — ED Provider Notes (Addendum)
[facility]  Note:  This document was prepared using Dragon voice recognition software and may include unintentional dictation errors.  MRN: [id number] DOB: [birth year] DATE: 11/19/22   Subjective:  Chief Complaint: No chief complaint on file.    HPI: Frederick Maddox is a 57 y.o. male presenting for continued productive cough and congestion. Patient was seen on 11/10/2022 and diagnosed with bronchitis at that time. He was started on Doxycycline and Benzonatate at that time. He reports no improvement. He states that his blood pressure was going while taking the medications, but then stopped then. He believes that he may have taken them for about a week, but is unsure. He still felt bad, so he restarted them again and has had two doses. He states that he continues to have productive cough and runny nose. He states last night he had a fever of 101. Reports myalgias. No known sick contacts. Denies sore throat, nasal congestion, dysuria. Endorses fever, cough, myalgias, rhionrrhea. Presents NAD.  Prior to Admission medications   Medication Sig Start Date End Date Taking? Authorizing Provider  benzonatate (TESSALON) 200 MG capsule Take 1 capsule (200 mg total) by mouth 3 (three) times daily as needed for cough. 11/10/22   Viviane Semidey P, PA-C  calcitRIOL (ROCALTROL) 0.25 MCG capsule  01/01/22   [provider]  calcium acetate (PHOSLO) 667 MG capsule Take by mouth. 01/05/22   [provider]  carvedilol (COREG) 6.25 MG tablet Take by mouth. 10/28/21   [provider]  doxycycline (VIBRAMYCIN) 100 MG capsule Take 1 capsule (100 mg total) by mouth 2 (two) times daily for 10 days. 11/10/22 11/20/22  Garth Diffley P, PA-C  furosemide (LASIX) 80 MG tablet Take by mouth. 01/01/22   [provider]  Multiple Vitamin (MULTIVITAMIN WITH MINERALS) TABS Take 1 tablet by mouth daily.    [provider]  omeprazole (PRILOSEC) 20 MG capsule Take 20 mg by mouth  daily. 08/08/11 08/07/12  Johnson, Clanford L, MD  pantoprazole (PROTONIX) 40 MG tablet Take by mouth. 12/04/21   [provider]  potassium chloride SA (K-DUR,KLOR-CON) 20 MEQ tablet Take 1 tablet (20 mEq total) by mouth daily. 07/05/12   Reather Littler, MD  Vitamins/Minerals TABS Take by mouth.    [provider]     No Known Allergies  History:   Past Medical History:  Diagnosis Date   Adrenal insufficiency (HCC)    Colonic polyp August 2013   Two removed, pathology pending   Depression    Diabetes mellitus    GERD (gastroesophageal reflux disease)    H/O hypotestosteronemia    Hiatal hernia    Tobacco use      Past Surgical History:  Procedure Laterality Date   COLONOSCOPY  08/23/2011   Procedure: COLONOSCOPY;  Surgeon: Florencia Reasons, MD;  Location: [facility] ENDOSCOPY;  Service: Endoscopy;  Laterality: N/A;   ESOPHAGOGASTRODUODENOSCOPY  08/23/2011   Procedure: ESOPHAGOGASTRODUODENOSCOPY (EGD);  Surgeon: Florencia Reasons, MD;  Location: [facility] ENDOSCOPY;  Service: Endoscopy;  Laterality: N/A;  Rm. [id number] ( formerly 3002)   EUS  09/08/2011   Procedure: FULL UPPER ENDOSCOPIC ULTRASOUND (EUS) RADIAL;  Surgeon: Willis Modena, MD;  Location: [facility] ENDOSCOPY;  Service: Endoscopy;  Laterality: N/A;  radia,l possible linea,r possible FNA   POLYPECTOMY      Family History  Problem Relation Age of Onset   Diabetes type II Mother        many family members   Hypertension Brother  many family members   Breast cancer Maternal Grandmother    Breast cancer Cousin    Brain cancer Maternal Grandmother     Social History   Tobacco Use   Smoking status: Former    Current packs/day: 0.00    Average packs/day: 0.5 packs/day for 15.0 years (7.5 ttl pk-yrs)    Types: Cigarettes    Start date: 08/21/1996    Quit date: 08/22/2011    Years since quitting: 11.2  Substance Use Topics   Alcohol use: Yes    Alcohol/week: 1.0 standard drink of alcohol    Types: 1 drink(s) per week     Comment: rarely   Drug use: No    Review of Systems  Constitutional:  Positive for fever.  HENT:  Positive for congestion and rhinorrhea. Negative for ear pain and sore throat.   Respiratory:  Positive for cough. Negative for shortness of breath.   Gastrointestinal:  Negative for abdominal pain, nausea and vomiting.     Objective:   Vitals: BP 118/70 (BP Location: Right Arm)   Pulse 79   Temp 98 F (36.7 C) (Oral)   Resp 18   SpO2 95%   Physical Exam Constitutional:      General: He is not in acute distress.    Appearance: Normal appearance. He is well-developed and normal weight. He is not ill-appearing or toxic-appearing.  HENT:     Head: Normocephalic and atraumatic.  Cardiovascular:     Rate and Rhythm: Normal rate and regular rhythm.     Heart sounds: Normal heart sounds.  Pulmonary:     Effort: Pulmonary effort is normal.     Breath sounds: Normal breath sounds.     Comments: Clear to auscultation bilaterally  Abdominal:     General: Bowel sounds are normal.     Palpations: Abdomen is soft.     Tenderness: There is no abdominal tenderness.  Skin:    General: Skin is warm and dry.  Neurological:     General: No focal deficit present.     Mental Status: He is alert.  Psychiatric:        Mood and Affect: Mood and affect normal.     Results:  Labs: Results for orders placed or performed during the hospital encounter of 11/19/22 (from the past 24 hour(s))  POC Covid + Flu A/B Antigen     Status: None   Collection Time: 11/19/22 11:52 AM  Result Value Ref Range   Influenza A Antigen, POC Negative Negative   Influenza B Antigen, POC Negative Negative   Covid Antigen, POC Negative Negative    Radiology: No results found.   UC Course/Treatments:  Procedures: Procedures   Medications Ordered in UC: Medications  albuterol (PROVENTIL) (2.5 MG/3ML) 0.083% nebulizer solution 2.5 mg (2.5 mg Nebulization Given 11/19/22 1153)     Assessment and Plan :      ICD-10-CM   1. Fever, unspecified  R50.9 CANCELED: SARS CORONAVIRUS 2 (TAT 6-24 HRS) Anterior Nasal Swab    CANCELED: SARS CORONAVIRUS 2 (TAT 6-24 HRS) Anterior Nasal Swab    2. Acute bronchitis, unspecified organism  J20.9      Fever Afebrile, nontoxic-appearing, NAD. VSS. DDX includes but not limited to: COVID, flu, bronchitis, pneumonia, viral URI Flu and COVID were negative today in office. CXR was negative for infiltrate. Several differentials at this point. It is possible that the patient has new URI versus resistant bacteria. CXR was unremarkable, but mycoplasma is large in the area. Will  stop Doxycycline and start Zithromax. Patient did start and stop ABX. Patient refused Rocephin injection. Of note, patient had CBC 4 days ago and WBC was unremarkable at that time. Recommend follow up with PCP next week for recheck. Strict ED precautions were given and patient verbalized understanding.  Acute Bronchitis, unspecified organism  Afebrile, nontoxic-appearing, NAD. VSS. DDX includes but not limited to: COVID, flu, viral URI, bronchitis, pneumonia Flu and COVID were negative today in office. CXR was negative for infiltrate. Several differentials at this point. It is possible that the patient has new URI versus resistant bacteria. CXR was unremarkable, but mycoplasma is large in the area. Will stop Doxycycline and start Zithromax. Patient did start and stop ABX. Patient refused Rocephin injection. Patient also refused steroid and inhaler at today's visit. Breathing treatment was given and patient reported no change in symptoms. Recommend follow up with PCP next week for recheck. Strict ED precautions were given and patient verbalized understanding.  ED Discharge Orders          Ordered    azithromycin (ZITHROMAX Z-PAK) 250 MG tablet        11/19/22 1205             PDMP not reviewed this encounter.     Timea Breed P, PA-C 11/19/22 1211    Pamla Pangle P, PA-C 11/19/22  1227

## 2022-11-19 NOTE — ED Triage Notes (Addendum)
Pt states his sxs have not improved c/o cough sometimes productive, chest congestion, runny, HA, 101 temp last night, sneezing, body aches. Denies: sore throat Pt states his BP started to be elevated after starting medication, so he stopped taking the Doxycline and Benzonatate 'for a few days' and his BP returned to normal so he started up the Rxs "a few days ago". States BP was runing 160s/105-115. Denies taking any OTC medications during this time.

## 2023-12-02 ENCOUNTER — Ambulatory Visit: Admission: EM | Admit: 2023-12-02 | Discharge: 2023-12-02 | Disposition: A | Discharge status: Home / Self Care

## 2023-12-02 ENCOUNTER — Other Ambulatory Visit: Payer: Self-pay

## 2023-12-02 DIAGNOSIS — J209 Acute bronchitis, unspecified: Secondary | ICD-10-CM | POA: Diagnosis not present

## 2023-12-02 DIAGNOSIS — R0981 Nasal congestion: Secondary | ICD-10-CM

## 2023-12-02 DIAGNOSIS — R051 Acute cough: Secondary | ICD-10-CM | POA: Diagnosis not present

## 2023-12-02 MED ORDER — AZITHROMYCIN 250 MG PO TABS: ORAL_TABLET | ORAL | 0 refills | Status: AC

## 2023-12-02 MED ORDER — PROMETHAZINE-DM 6.25-15 MG/5ML PO SYRP: 5.0000 mL | ORAL_SOLUTION | Freq: Four times a day (QID) | ORAL | 0 refills | Status: AC | PRN

## 2023-12-02 NOTE — ED Provider Notes (Signed)
 [facility]  Note:  This document was prepared using Dragon voice recognition software and may include unintentional dictation errors.  MRN: [id number] DOB: [birth year] DATE: 12/02/23   Subjective:  Chief Complaint:  Chief Complaint  Patient presents with   Cough    HPI: Frederick Maddox is a 58 y.o. male presenting for productive cough and nasal congestion for over a week. Patient states he started with a cough and congestion over a week ago, but cannot seem to get rid of the cough. He reports a persistent productive cough. He also reports scratchy throat and slight nasal congestion. He became concerned when he started with myalgias today. 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 He reports taking leftover Promethazine -DM with relief, but no resolution.  Of note, patient is still on dialysis and recently went to his appointment today. Denies fever, nausea/vomiting, abdominal pain, otalgia. Endorses cough, congestion, myalgias. Presents NAD.  Prior to Admission medications   Medication Sig Start Date End Date Taking? Authorizing Provider  amLODipine (NORVASC) 10 MG tablet Take 10 mg by mouth daily.   Yes [provider]  hydrALAZINE (APRESOLINE) 50 MG tablet Take 50 mg by mouth in the morning and at bedtime.   Yes [provider]  losartan (COZAAR) 50 MG tablet Take 50 mg by mouth daily.   Yes [provider]  calcium acetate (PHOSLO) 667 MG capsule Take by mouth. 01/05/22   [provider]  furosemide (LASIX) 80 MG tablet Take by mouth. 01/01/22   [provider]  Multiple Vitamin (MULTIVITAMIN WITH MINERALS) TABS Take 1 tablet by mouth daily.    [provider]  pantoprazole  (PROTONIX ) 40 MG tablet Take by mouth. 12/04/21   [provider]     No Known Allergies  History:   Past Medical History:  Diagnosis Date   Adrenal insufficiency    Colonic polyp August 2013   Two removed, pathology  pending   Depression    Diabetes mellitus    GERD (gastroesophageal reflux disease)    H/O hypotestosteronemia    Hiatal hernia    Tobacco use      Past Surgical History:  Procedure Laterality Date   COLONOSCOPY  08/23/2011   Procedure: COLONOSCOPY;  Surgeon: Lamar LULLA Bunk, MD;  Location: [facility] ENDOSCOPY;  Service: Endoscopy;  Laterality: N/A;   ESOPHAGOGASTRODUODENOSCOPY  08/23/2011   Procedure: ESOPHAGOGASTRODUODENOSCOPY (EGD);  Surgeon: Lamar LULLA Bunk, MD;  Location: [facility] ENDOSCOPY;  Service: Endoscopy;  Laterality: N/A;  Rm. [id number] ( formerly 3002)   EUS  09/08/2011   Procedure: FULL UPPER ENDOSCOPIC ULTRASOUND (EUS) RADIAL;  Surgeon: Elsie Cree, MD;  Location: [facility] ENDOSCOPY;  Service: Endoscopy;  Laterality: N/A;  radia,l possible linea,r possible FNA   POLYPECTOMY      Family History  Problem Relation Age of Onset   Diabetes type II Mother        many family members   Hypertension Brother        many family members   Breast cancer Maternal Grandmother    Breast cancer Cousin    Brain cancer Maternal Grandmother     Social History   Tobacco Use   Smoking status: Former    Current packs/day: 0.00    Average packs/day: 0.5 packs/day for 15.0 years (7.5 ttl pk-yrs)    Types: Cigarettes    Start date: 08/21/1996    Quit date: 08/22/2011    Years since quitting: 12.2  Substance Use Topics  Alcohol use: Yes    Alcohol/week: 1.0 standard drink of alcohol    Types: 1 drink(s) per week    Comment: rarely   Drug use: No    Review of Systems  Constitutional:  Negative for fever.  HENT:  Positive for congestion and sore throat. Negative for ear pain.   Respiratory:  Positive for cough.   Gastrointestinal:  Negative for abdominal pain, nausea and vomiting.  Musculoskeletal:  Positive for myalgias.     Objective:   Vitals: BP (!) 169/97 (BP Location: Right Arm)   Pulse 86   Temp 99 F (37.2 C) (Oral)   Resp 16   SpO2 95%   Physical Exam Constitutional:       General: He is not in acute distress.    Appearance: Normal appearance. He is well-developed and normal weight. He is not ill-appearing or toxic-appearing.  HENT:     Head: Normocephalic and atraumatic.     Right Ear: Ear canal normal. A middle ear effusion is present.     Left Ear: Ear canal normal. A middle ear effusion is present.     Mouth/Throat:     Pharynx: Oropharynx is clear. Uvula midline. No pharyngeal swelling, oropharyngeal exudate or posterior oropharyngeal erythema.     Tonsils: No tonsillar exudate or tonsillar abscesses.  Cardiovascular:     Rate and Rhythm: Normal rate and regular rhythm.     Heart sounds: Normal heart sounds.  Pulmonary:     Effort: Pulmonary effort is normal.     Breath sounds: Normal breath sounds.     Comments: Clear to auscultation bilaterally  Abdominal:     General: Bowel sounds are normal.     Palpations: Abdomen is soft.     Tenderness: There is no abdominal tenderness. There is no right CVA tenderness or left CVA tenderness.  Skin:    General: Skin is warm and dry.  Neurological:     General: No focal deficit present.     Mental Status: He is alert.  Psychiatric:        Mood and Affect: Mood and affect normal.     Results:  Labs: No results found for this or any previous visit (from the past 24 hours).  Radiology: No results found.   UC Course/Treatments:  Procedures: Procedures   Medications Ordered in UC: Medications - No data to display   Assessment and Plan :     ICD-10-CM   1. Acute bronchitis, unspecified organism  J20.9     2. Acute cough  R05.1     3. Nasal congestion  R09.81      Acute bronchitis, unspecified organism Acute cough Nasal congestion Afebrile, nontoxic-appearing, NAD. VSS. DDX includes but not limited to: COVID, flu, bronchitis, pneumonia, viral URI COVID and flu not ordered given length of symptoms. Patient agreeable. Given persistent cough with worsening as well as comorbidities, Zithromax   250mg  as directed was prescribed for bacterial bronchitis. Promethazine -DM QID PRN was prescribed for cough. Encouraged rest and fluids. Strict ED precautions were given and patient verbalized understanding.  ED Discharge Orders          Ordered    promethazine -dextromethorphan (PROMETHAZINE -DM) 6.25-15 MG/5ML syrup  4 times daily PRN        12/02/23 1601    azithromycin  (ZITHROMAX  Z-PAK) 250 MG tablet        12/02/23 1601             PDMP not reviewed this encounter.  Basilia Ulanda SQUIBB, PA-C 12/02/23 1610

## 2023-12-02 NOTE — ED Triage Notes (Signed)
 C/O productive cough with yellow phlegm, nasal congestion and body aches. Patient denies fever. Patient states symptoms for over a week. Patient took cough medication from previous visit.

## 2023-12-02 NOTE — Discharge Instructions (Signed)
 A prescription was sent for Doxycycline . This is an antibiotic used to treat upper respiratory infections. Take as directed.   I have also sent a cough medicine to your pharmacy. You should take this at bedtime when you are not working or driving because it can make you sleepy.  Return in 3-4 days if no improvement. It is very important for you to pay attention to any new symptoms or worsening of your current condition. Please go directly to the Emergency Department immediately should you begin to have any of the following symptoms: shortness of breath, chest pain or difficulty breathing.
